# Patient Record
Sex: Female | Born: 1960 | Race: Black or African American | Hispanic: No | Marital: Married | Smoking: Never smoker
Health system: Southern US, Community
[De-identification: ages and names within clinical notes are randomized; demographics above are authoritative.]

## PROBLEM LIST (undated history)

## (undated) DIAGNOSIS — J942 Hemothorax: Secondary | ICD-10-CM

## (undated) DIAGNOSIS — Z9889 Other specified postprocedural states: Secondary | ICD-10-CM

## (undated) DIAGNOSIS — E785 Hyperlipidemia, unspecified: Secondary | ICD-10-CM

## (undated) DIAGNOSIS — R112 Nausea with vomiting, unspecified: Secondary | ICD-10-CM

## (undated) HISTORY — PX: ABDOMINAL HYSTERECTOMY: SHX81

## (undated) HISTORY — PX: CHEST TUBE INSERTION: SHX231

## (undated) HISTORY — PX: BREAST LUMPECTOMY: SHX2

---

## 2015-03-29 ENCOUNTER — Emergency Department (INDEPENDENT_AMBULATORY_CARE_PROVIDER_SITE_OTHER): Payer: TRICARE For Life (TFL)

## 2015-03-29 ENCOUNTER — Emergency Department (INDEPENDENT_AMBULATORY_CARE_PROVIDER_SITE_OTHER)
Admission: EM | Admit: 2015-03-29 | Discharge: 2015-03-29 | Disposition: A | Discharge status: Home / Self Care | Payer: TRICARE For Life (TFL) | Source: Home / Self Care | Attending: Emergency Medicine | Admitting: Emergency Medicine

## 2015-03-29 ENCOUNTER — Encounter: Payer: Self-pay | Admitting: *Deleted

## 2015-03-29 DIAGNOSIS — M436 Torticollis: Secondary | ICD-10-CM | POA: Diagnosis not present

## 2015-03-29 DIAGNOSIS — M47892 Other spondylosis, cervical region: Secondary | ICD-10-CM | POA: Diagnosis not present

## 2015-03-29 HISTORY — DX: Hyperlipidemia, unspecified: E78.5

## 2015-03-29 MED ORDER — HYDROCODONE-ACETAMINOPHEN 5-325 MG PO TABS: 2.0000 | ORAL_TABLET | ORAL | Status: DC | PRN

## 2015-03-29 MED ORDER — PREDNISONE 10 MG PO TABS: ORAL_TABLET | ORAL | Status: DC

## 2015-03-29 NOTE — ED Notes (Signed)
Pt c/o 5 days of upper back, neck and head pain without injury. Ears feel stuffy.

## 2015-03-29 NOTE — ED Provider Notes (Signed)
CSN: [id number]     Arrival date & time 03/29/15  1209 History   First MD Initiated Contact with Patient 03/29/15 1239     Chief Complaint  Patient presents with  . Neck Pain   (Consider location/radiation/quality/duration/timing/severity/associated sxs/prior Treatment) Patient is a 54 y.o. female presenting with neck pain.  Neck Pain Pain location:  Occipital region Quality:  Aching Pain radiates to:  Does not radiate Pain severity:  Moderate Pain is:  Same all the time Onset quality:  Sudden Timing:  Constant Progression:  Worsening Chronicity:  New Context: not recent injury   Relieved by:  Nothing Worsened by:  Nothing tried Ineffective treatments:  None tried Risk factors: no hx of head and neck radiation and no recent head injury   Pt has a history of a disc problem in low back.  Pt recently has had a steroid injection.  Pt reports she has been riding a bike and pain seemed to start after riding  Past Medical History  Diagnosis Date  . Hyperlipidemia    Past Surgical History  Procedure Laterality Date  . Abdominal hysterectomy      partial   Family History  Problem Relation Age of Onset  . Cancer Father     Lung CA  . Cancer Sister     Breast CA   History  Substance Use Topics  . Smoking status: Never Smoker   . Smokeless tobacco: Never Used  . Alcohol Use: No   OB History    No data available     Review of Systems  Musculoskeletal: Positive for neck pain.  All other systems reviewed and are negative.   Allergies  Review of patient's allergies indicates no known allergies.  Home Medications   Prior to Admission medications   Medication Sig Start Date End Date Taking? Authorizing Provider  Simvastatin (ZOCOR PO) Take by mouth.   Yes Historical Provider, MD   BP 147/84 mmHg  Pulse 67  Temp(Src) 98 F (36.7 C) (Oral)  Resp 14  Ht 5\' 3"  (1.6 m)  Wt 147 lb (66.679 kg)  BMI 26.05 kg/m2  SpO2 100% Physical Exam  Constitutional: She is  oriented to person, place, and time. She appears well-developed and well-nourished.  HENT:  Head: Normocephalic and atraumatic.  Right Ear: External ear normal.  Left Ear: External ear normal.  Mouth/Throat: Oropharynx is clear and moist.  Eyes: EOM are normal.  Neck: Normal range of motion.  Cardiovascular: Normal rate and normal heart sounds.   Pulmonary/Chest: Effort normal.  Abdominal: She exhibits no distension.  Musculoskeletal: Normal range of motion.  Diffusely tender c spine,  Pain with movement.   Neurological: She is alert and oriented to person, place, and time.  Psychiatric: She has a normal mood and affect.  Nursing note and vitals reviewed.   ED Course  Procedures (including critical care time) Labs Review Labs Reviewed - No data to display  Imaging Review Dg Cervical Spine Complete  03/29/2015   CLINICAL DATA:  Mid cervical neck pain for 1 week. No known injury. Initial encounter.  EXAM: CERVICAL SPINE  4+ VIEWS  COMPARISON:  None.  FINDINGS: The prevertebral soft tissues are normal. The alignment is anatomic through T1 aside from a minimal convex right scoliosis. There is no evidence of acute fracture or traumatic subluxation. The C1-2 articulation appears normal in the AP projection. There is mild uncinate spurring and disc space loss at C3-4 and C4-5. Oblique views demonstrate no significant osseous foraminal stenosis.  IMPRESSION: No acute osseous findings or significant malalignment. Minimal spondylosis.   Electronically Signed   By: Carey Bullocks M.D.   On: 03/29/2015 14:05     MDM  Pt counseled on probable torticollis as well as xray report of arthritic spurring.   1. Torticollis, acute    Prednisone taper Hydrocodone AVS See your Physician for recheck     Elson Areas, PA-C 03/29/15 1431

## 2015-03-29 NOTE — Discharge Instructions (Signed)

## 2015-12-23 ENCOUNTER — Other Ambulatory Visit: Payer: Self-pay | Admitting: Neurological Surgery

## 2016-01-18 ENCOUNTER — Other Ambulatory Visit (HOSPITAL_COMMUNITY): Payer: Self-pay | Admitting: *Deleted

## 2016-01-18 NOTE — Pre-Procedure Instructions (Signed)
    Catherine Grant  01/18/2016     Your procedure is scheduled on Thursday, January 26, 2016 at 7:30 AM.   [facility] Admitting Office at 5:30 AM.   Call this number if you have problems the morning of surgery: [phone]   Any questions prior to day of surgery, please call [phone] between 8 & 4 PM.   Remember:  Do not eat food or drink liquids after midnight Wednesday, 01/25/16.  Take these medicines the morning of surgery with A SIP OF WATER: Gabapentin (Neurontin)   Do not wear jewelry, make-up or nail polish.  Do not wear lotions, powders, or perfumes.  You may wear deodorant.  Do not shave 48 hours prior to surgery.    Do not bring valuables to the hospital.  [facility] is not responsible for any belongings or valuables.  Contacts, dentures or bridgework may not be worn into surgery.  Leave your suitcase in the car.  After surgery it may be brought to your room.  For patients admitted to the hospital, discharge time will be determined by your treatment team.  Special instructions:  See "Preparing for Surgery" Instruction sheet.  Please read over the following fact sheets that you were given. Pain Booklet, Coughing and Deep Breathing, Blood Transfusion Information, MRSA Information and Surgical Site Infection Prevention

## 2016-01-19 ENCOUNTER — Ambulatory Visit (HOSPITAL_COMMUNITY)
Admission: RE | Admit: 2016-01-19 | Discharge: 2016-01-19 | Disposition: A | Discharge status: Home / Self Care | Source: Ambulatory Visit | Attending: Neurological Surgery | Admitting: Neurological Surgery

## 2016-01-19 ENCOUNTER — Encounter (HOSPITAL_COMMUNITY)
Admission: RE | Admit: 2016-01-19 | Discharge: 2016-01-19 | Disposition: A | Discharge status: Home / Self Care | Source: Ambulatory Visit | Attending: Neurological Surgery | Admitting: Neurological Surgery

## 2016-01-19 ENCOUNTER — Encounter (HOSPITAL_COMMUNITY): Payer: Self-pay

## 2016-01-19 DIAGNOSIS — Z01818 Encounter for other preprocedural examination: Secondary | ICD-10-CM | POA: Insufficient documentation

## 2016-01-19 DIAGNOSIS — M4310 Spondylolisthesis, site unspecified: Secondary | ICD-10-CM

## 2016-01-19 DIAGNOSIS — Z0183 Encounter for blood typing: Secondary | ICD-10-CM | POA: Insufficient documentation

## 2016-01-19 DIAGNOSIS — Z01812 Encounter for preprocedural laboratory examination: Secondary | ICD-10-CM | POA: Diagnosis not present

## 2016-01-19 HISTORY — DX: Nausea with vomiting, unspecified: R11.2

## 2016-01-19 HISTORY — DX: Other specified postprocedural states: Z98.890

## 2016-01-19 HISTORY — DX: Hemothorax: J94.2

## 2016-01-19 LAB — PROTIME-INR
INR: 0.96 (ref 0.00–1.49)
Prothrombin Time: 13 seconds (ref 11.6–15.2)

## 2016-01-19 LAB — CBC WITH DIFFERENTIAL/PLATELET
Basophils Absolute: 0 10*3/uL (ref 0.0–0.1)
Basophils Relative: 0 %
Eosinophils Absolute: 0.1 10*3/uL (ref 0.0–0.7)
Eosinophils Relative: 2 %
HCT: 40.5 % (ref 36.0–46.0)
Hemoglobin: 12.6 g/dL (ref 12.0–15.0)
Lymphocytes Relative: 39 %
Lymphs Abs: 1.8 10*3/uL (ref 0.7–4.0)
MCH: 29.7 pg (ref 26.0–34.0)
MCHC: 31.1 g/dL (ref 30.0–36.0)
MCV: 95.5 fL (ref 78.0–100.0)
Monocytes Absolute: 0.3 10*3/uL (ref 0.1–1.0)
Monocytes Relative: 7 %
Neutro Abs: 2.4 10*3/uL (ref 1.7–7.7)
Neutrophils Relative %: 52 %
Platelets: 241 10*3/uL (ref 150–400)
RBC: 4.24 MIL/uL (ref 3.87–5.11)
RDW: 13.3 % (ref 11.5–15.5)
WBC: 4.6 10*3/uL (ref 4.0–10.5)

## 2016-01-19 LAB — SURGICAL PCR SCREEN
MRSA, PCR: NEGATIVE
Staphylococcus aureus: POSITIVE — AB

## 2016-01-19 LAB — TYPE AND SCREEN
ABO/RH(D): O POS
ANTIBODY SCREEN: NEGATIVE

## 2016-01-19 LAB — BASIC METABOLIC PANEL WITH GFR
Anion gap: 9 (ref 5–15)
BUN: 14 mg/dL (ref 6–20)
CO2: 23 mmol/L (ref 22–32)
Calcium: 9.6 mg/dL (ref 8.9–10.3)
Chloride: 111 mmol/L (ref 101–111)
Creatinine, Ser: 1.12 mg/dL — ABNORMAL HIGH (ref 0.44–1.00)
GFR calc Af Amer: >60 mL/min
GFR calc non Af Amer: 55 mL/min — ABNORMAL LOW
Glucose, Bld: 78 mg/dL (ref 65–99)
Potassium: 4.5 mmol/L (ref 3.5–5.1)
Sodium: 143 mmol/L (ref 135–145)

## 2016-01-19 LAB — ABO/RH: ABO/RH(D): O POS

## 2016-01-25 MED ORDER — DEXAMETHASONE SODIUM PHOSPHATE 10 MG/ML IJ SOLN
10.0000 mg | INTRAMUSCULAR | Status: AC
  Administered 2016-01-26: 10 mg via INTRAVENOUS
  Filled 2016-01-25: qty 1

## 2016-01-25 MED ORDER — CEFAZOLIN SODIUM-DEXTROSE 2-4 GM/100ML-% IV SOLN
2.0000 g | INTRAVENOUS | Status: AC
  Administered 2016-01-26: 2 g via INTRAVENOUS
  Filled 2016-01-25: qty 100

## 2016-01-26 ENCOUNTER — Inpatient Hospital Stay (HOSPITAL_COMMUNITY)

## 2016-01-26 ENCOUNTER — Encounter (HOSPITAL_COMMUNITY): Payer: Self-pay | Admitting: Neurological Surgery

## 2016-01-26 ENCOUNTER — Inpatient Hospital Stay (HOSPITAL_COMMUNITY): Admitting: Anesthesiology

## 2016-01-26 ENCOUNTER — Inpatient Hospital Stay (HOSPITAL_COMMUNITY)
Admission: RE | Admit: 2016-01-26 | Discharge: 2016-01-27 | DRG: 460 | Disposition: A | Discharge status: Home / Self Care | Source: Ambulatory Visit | Attending: Neurological Surgery | Admitting: Neurological Surgery

## 2016-01-26 ENCOUNTER — Encounter (HOSPITAL_COMMUNITY)
Admission: RE | Disposition: A | Discharge status: Home / Self Care | Payer: Self-pay | Source: Ambulatory Visit | Attending: Neurological Surgery

## 2016-01-26 DIAGNOSIS — Z888 Allergy status to other drugs, medicaments and biological substances status: Secondary | ICD-10-CM | POA: Diagnosis not present

## 2016-01-26 DIAGNOSIS — M549 Dorsalgia, unspecified: Secondary | ICD-10-CM

## 2016-01-26 DIAGNOSIS — M4806 Spinal stenosis, lumbar region: Secondary | ICD-10-CM | POA: Diagnosis present

## 2016-01-26 DIAGNOSIS — Z79899 Other long term (current) drug therapy: Secondary | ICD-10-CM | POA: Diagnosis not present

## 2016-01-26 DIAGNOSIS — Z981 Arthrodesis status: Secondary | ICD-10-CM

## 2016-01-26 HISTORY — PX: TRANSFORAMINAL LUMBAR INTERBODY FUSION (TLIF) WITH PEDICLE SCREW FIXATION 1 LEVEL: SHX6141

## 2016-01-26 SURGERY — TRANSFORAMINAL LUMBAR INTERBODY FUSION (TLIF) WITH PEDICLE SCREW FIXATION 1 LEVEL
Anesthesia: General | Site: Back

## 2016-01-26 MED ORDER — MORPHINE SULFATE (PF) 2 MG/ML IV SOLN
1.0000 mg | INTRAVENOUS | Status: DC | PRN
  Administered 2016-01-26: 4 mg via INTRAVENOUS
  Filled 2016-01-26: qty 2

## 2016-01-26 MED ORDER — GABAPENTIN 300 MG PO CAPS
300.0000 mg | ORAL_CAPSULE | Freq: Every day | ORAL | Status: DC
  Administered 2016-01-27: 300 mg via ORAL
  Filled 2016-01-26: qty 1

## 2016-01-26 MED ORDER — SUCCINYLCHOLINE CHLORIDE 20 MG/ML IJ SOLN
INTRAMUSCULAR | Status: AC
  Filled 2016-01-26: qty 1

## 2016-01-26 MED ORDER — FENTANYL CITRATE (PF) 100 MCG/2ML IJ SOLN
25.0000 ug | INTRAMUSCULAR | Status: DC | PRN
  Administered 2016-01-26 (×3): 50 ug via INTRAVENOUS

## 2016-01-26 MED ORDER — MENTHOL 3 MG MT LOZG: 1.0000 | LOZENGE | OROMUCOSAL | Status: DC | PRN

## 2016-01-26 MED ORDER — THROMBIN 5000 UNITS EX SOLR
OROMUCOSAL | Status: DC | PRN
  Administered 2016-01-26: 10 mL via TOPICAL

## 2016-01-26 MED ORDER — SCOPOLAMINE 1 MG/3DAYS TD PT72
MEDICATED_PATCH | TRANSDERMAL | Status: DC | PRN
  Administered 2016-01-26: 1 via TRANSDERMAL

## 2016-01-26 MED ORDER — THROMBIN 20000 UNITS EX SOLR
CUTANEOUS | Status: DC | PRN
  Administered 2016-01-26: 20 mL via TOPICAL

## 2016-01-26 MED ORDER — POTASSIUM CHLORIDE IN NACL 20-0.9 MEQ/L-% IV SOLN
INTRAVENOUS | Status: DC
  Filled 2016-01-26 (×3): qty 1000

## 2016-01-26 MED ORDER — FENTANYL CITRATE (PF) 250 MCG/5ML IJ SOLN
INTRAMUSCULAR | Status: AC
  Filled 2016-01-26: qty 5

## 2016-01-26 MED ORDER — HYDROMORPHONE HCL 1 MG/ML IJ SOLN
INTRAMUSCULAR | Status: AC
  Administered 2016-01-26: 0.5 mg via INTRAVENOUS
  Filled 2016-01-26: qty 1

## 2016-01-26 MED ORDER — HYDROMORPHONE HCL 1 MG/ML IJ SOLN
0.2500 mg | INTRAMUSCULAR | Status: DC | PRN
  Administered 2016-01-26: 0.5 mg via INTRAVENOUS

## 2016-01-26 MED ORDER — PROPOFOL 10 MG/ML IV BOLUS
INTRAVENOUS | Status: AC
  Filled 2016-01-26: qty 20

## 2016-01-26 MED ORDER — ROCURONIUM BROMIDE 50 MG/5ML IV SOLN
INTRAVENOUS | Status: AC
  Filled 2016-01-26: qty 1

## 2016-01-26 MED ORDER — OXYCODONE-ACETAMINOPHEN 5-325 MG PO TABS
ORAL_TABLET | ORAL | Status: AC
  Administered 2016-01-26: 2 via ORAL
  Filled 2016-01-26: qty 2

## 2016-01-26 MED ORDER — SCOPOLAMINE 1 MG/3DAYS TD PT72
MEDICATED_PATCH | TRANSDERMAL | Status: AC
  Filled 2016-01-26: qty 1

## 2016-01-26 MED ORDER — PHENYLEPHRINE 40 MCG/ML (10ML) SYRINGE FOR IV PUSH (FOR BLOOD PRESSURE SUPPORT)
PREFILLED_SYRINGE | INTRAVENOUS | Status: AC
  Filled 2016-01-26: qty 10

## 2016-01-26 MED ORDER — CELECOXIB 200 MG PO CAPS
200.0000 mg | ORAL_CAPSULE | Freq: Two times a day (BID) | ORAL | Status: DC
  Administered 2016-01-26 – 2016-01-27 (×2): 200 mg via ORAL
  Filled 2016-01-26 (×2): qty 1

## 2016-01-26 MED ORDER — ACETAMINOPHEN 325 MG PO TABS: 650.0000 mg | ORAL_TABLET | ORAL | Status: DC | PRN

## 2016-01-26 MED ORDER — SUCCINYLCHOLINE CHLORIDE 20 MG/ML IJ SOLN
INTRAMUSCULAR | Status: DC | PRN
  Administered 2016-01-26: 120 mg via INTRAVENOUS

## 2016-01-26 MED ORDER — PROPOFOL 1000 MG/100ML IV EMUL
INTRAVENOUS | Status: AC
  Administered 2016-01-26: 100 ug/kg/min via INTRAVENOUS
  Filled 2016-01-26: qty 100

## 2016-01-26 MED ORDER — SODIUM CHLORIDE 0.9 % IV SOLN: 250.0000 mL | INTRAVENOUS | Status: DC

## 2016-01-26 MED ORDER — 0.9 % SODIUM CHLORIDE (POUR BTL) OPTIME
TOPICAL | Status: DC | PRN
  Administered 2016-01-26: 1000 mL

## 2016-01-26 MED ORDER — PHENYLEPHRINE HCL 10 MG/ML IJ SOLN
10.0000 mg | INTRAVENOUS | Status: DC | PRN
  Administered 2016-01-26: 15 ug/min via INTRAVENOUS

## 2016-01-26 MED ORDER — ONDANSETRON HCL 4 MG/2ML IJ SOLN
INTRAMUSCULAR | Status: DC | PRN
  Administered 2016-01-26: 4 mg via INTRAVENOUS

## 2016-01-26 MED ORDER — OXYCODONE-ACETAMINOPHEN 5-325 MG PO TABS
1.0000 | ORAL_TABLET | ORAL | Status: DC | PRN
  Administered 2016-01-26 – 2016-01-27 (×4): 2 via ORAL
  Filled 2016-01-26 (×3): qty 2

## 2016-01-26 MED ORDER — GABAPENTIN 300 MG PO CAPS: 300.0000 mg | ORAL_CAPSULE | Freq: Two times a day (BID) | ORAL | Status: DC

## 2016-01-26 MED ORDER — SODIUM CHLORIDE 0.9 % IJ SOLN
INTRAMUSCULAR | Status: AC
  Filled 2016-01-26: qty 10

## 2016-01-26 MED ORDER — LIDOCAINE HCL (CARDIAC) 20 MG/ML IV SOLN
INTRAVENOUS | Status: AC
  Filled 2016-01-26: qty 5

## 2016-01-26 MED ORDER — PHENOL 1.4 % MT LIQD: 1.0000 | OROMUCOSAL | Status: DC | PRN

## 2016-01-26 MED ORDER — LIDOCAINE HCL (CARDIAC) 20 MG/ML IV SOLN
INTRAVENOUS | Status: DC | PRN
Start: 2016-01-26 — End: 2016-01-26
  Administered 2016-01-26: 100 mg via INTRAVENOUS

## 2016-01-26 MED ORDER — ONDANSETRON HCL 4 MG/2ML IJ SOLN
INTRAMUSCULAR | Status: AC
  Filled 2016-01-26: qty 2

## 2016-01-26 MED ORDER — PROPOFOL 1000 MG/100ML IV EMUL
INTRAVENOUS | Status: AC
  Filled 2016-01-26: qty 100

## 2016-01-26 MED ORDER — SODIUM CHLORIDE 0.9% FLUSH: 3.0000 mL | INTRAVENOUS | Status: DC | PRN

## 2016-01-26 MED ORDER — METHOCARBAMOL 500 MG PO TABS
ORAL_TABLET | ORAL | Status: AC
  Administered 2016-01-26: 500 mg via ORAL
  Filled 2016-01-26: qty 1

## 2016-01-26 MED ORDER — LACTATED RINGERS IV SOLN: INTRAVENOUS | Status: DC

## 2016-01-26 MED ORDER — SODIUM CHLORIDE 0.9% FLUSH
3.0000 mL | Freq: Two times a day (BID) | INTRAVENOUS | Status: DC
  Administered 2016-01-26: 3 mL via INTRAVENOUS

## 2016-01-26 MED ORDER — ONDANSETRON HCL 4 MG/2ML IJ SOLN: 4.0000 mg | INTRAMUSCULAR | Status: DC | PRN

## 2016-01-26 MED ORDER — LACTATED RINGERS IV SOLN
INTRAVENOUS | Status: DC | PRN
  Administered 2016-01-26 (×2): via INTRAVENOUS

## 2016-01-26 MED ORDER — METHOCARBAMOL 1000 MG/10ML IJ SOLN
500.0000 mg | Freq: Four times a day (QID) | INTRAMUSCULAR | Status: DC | PRN
  Filled 2016-01-26: qty 5

## 2016-01-26 MED ORDER — EPHEDRINE SULFATE 50 MG/ML IJ SOLN
INTRAMUSCULAR | Status: AC
  Filled 2016-01-26: qty 1

## 2016-01-26 MED ORDER — FENTANYL CITRATE (PF) 250 MCG/5ML IJ SOLN
INTRAMUSCULAR | Status: DC | PRN
  Administered 2016-01-26: 50 ug via INTRAVENOUS
  Administered 2016-01-26: 150 ug via INTRAVENOUS
  Administered 2016-01-26: 50 ug via INTRAVENOUS
  Administered 2016-01-26: 100 ug via INTRAVENOUS

## 2016-01-26 MED ORDER — FENTANYL CITRATE (PF) 100 MCG/2ML IJ SOLN
INTRAMUSCULAR | Status: AC
  Filled 2016-01-26: qty 2

## 2016-01-26 MED ORDER — CEFAZOLIN SODIUM 1-5 GM-% IV SOLN
1.0000 g | Freq: Three times a day (TID) | INTRAVENOUS | Status: AC
  Administered 2016-01-26 (×2): 1 g via INTRAVENOUS
  Filled 2016-01-26 (×2): qty 50

## 2016-01-26 MED ORDER — PROPOFOL 10 MG/ML IV BOLUS
INTRAVENOUS | Status: DC | PRN
  Administered 2016-01-26: 180 mg via INTRAVENOUS

## 2016-01-26 MED ORDER — GABAPENTIN 300 MG PO CAPS
600.0000 mg | ORAL_CAPSULE | Freq: Every day | ORAL | Status: DC
  Administered 2016-01-26: 600 mg via ORAL
  Filled 2016-01-26: qty 2

## 2016-01-26 MED ORDER — BACITRACIN 50000 UNITS IM SOLR
INTRAMUSCULAR | Status: DC | PRN
  Administered 2016-01-26: 500 mL

## 2016-01-26 MED ORDER — MIDAZOLAM HCL 2 MG/2ML IJ SOLN
INTRAMUSCULAR | Status: AC
  Filled 2016-01-26: qty 2

## 2016-01-26 MED ORDER — FENTANYL CITRATE (PF) 100 MCG/2ML IJ SOLN
INTRAMUSCULAR | Status: AC
  Administered 2016-01-26: 50 ug via INTRAVENOUS
  Filled 2016-01-26: qty 2

## 2016-01-26 MED ORDER — MEPERIDINE HCL 25 MG/ML IJ SOLN: 6.2500 mg | INTRAMUSCULAR | Status: DC | PRN

## 2016-01-26 MED ORDER — EPHEDRINE SULFATE 50 MG/ML IJ SOLN
INTRAMUSCULAR | Status: DC | PRN
  Administered 2016-01-26: 10 mg via INTRAVENOUS

## 2016-01-26 MED ORDER — PROCHLORPERAZINE EDISYLATE 5 MG/ML IJ SOLN: 10.0000 mg | Freq: Four times a day (QID) | INTRAMUSCULAR | Status: DC | PRN

## 2016-01-26 MED ORDER — PHENYLEPHRINE HCL 10 MG/ML IJ SOLN
INTRAMUSCULAR | Status: DC | PRN
  Administered 2016-01-26: 80 ug via INTRAVENOUS
  Administered 2016-01-26: 120 ug via INTRAVENOUS
  Administered 2016-01-26: 40 ug via INTRAVENOUS

## 2016-01-26 MED ORDER — METHOCARBAMOL 500 MG PO TABS
500.0000 mg | ORAL_TABLET | Freq: Four times a day (QID) | ORAL | Status: DC | PRN
  Administered 2016-01-26 (×2): 500 mg via ORAL
  Filled 2016-01-26: qty 1

## 2016-01-26 MED ORDER — MIDAZOLAM HCL 2 MG/2ML IJ SOLN
INTRAMUSCULAR | Status: DC | PRN
  Administered 2016-01-26: 2 mg via INTRAVENOUS

## 2016-01-26 MED ORDER — ACETAMINOPHEN 650 MG RE SUPP: 650.0000 mg | RECTAL | Status: DC | PRN

## 2016-01-26 SURGICAL SUPPLY — 60 items
APL SKNCLS STERI-STRIP NONHPOA (GAUZE/BANDAGES/DRESSINGS) ×1
BAG DECANTER FOR FLEXI CONT (MISCELLANEOUS) ×3 IMPLANT
BENZOIN TINCTURE PRP APPL 2/3 (GAUZE/BANDAGES/DRESSINGS) ×3 IMPLANT
BIT DRILL PLIF MAS 5.0MM DISP (DRILL) ×1 IMPLANT
BLADE CLIPPER SURG (BLADE) IMPLANT
BUR MATCHSTICK NEURO 3.0 LAGG (BURR) ×3 IMPLANT
CAGE ALTERA 8X12-8 (Cage) ×3 IMPLANT
CANISTER SUCT 3000ML PPV (MISCELLANEOUS) ×3 IMPLANT
CLIP NEUROVISION LG (CLIP) ×3 IMPLANT
CLOSURE WOUND 1/2 X4 (GAUZE/BANDAGES/DRESSINGS) ×2
CONT SPEC 4OZ CLIKSEAL STRL BL (MISCELLANEOUS) ×3 IMPLANT
COVER BACK TABLE 60X90IN (DRAPES) ×3 IMPLANT
DRAPE C-ARM 42X72 X-RAY (DRAPES) ×6 IMPLANT
DRAPE LAPAROTOMY 100X72X124 (DRAPES) ×3 IMPLANT
DRAPE POUCH INSTRU U-SHP 10X18 (DRAPES) ×3 IMPLANT
DRAPE SURG 17X23 STRL (DRAPES) ×3 IMPLANT
DRILL PLIF MAS 5.0MM DISP (DRILL) ×3
DRSG OPSITE 4X5.5 SM (GAUZE/BANDAGES/DRESSINGS) ×3 IMPLANT
DRSG OPSITE POSTOP 4X6 (GAUZE/BANDAGES/DRESSINGS) ×3 IMPLANT
DURAPREP 26ML APPLICATOR (WOUND CARE) ×3 IMPLANT
ELECT REM PT RETURN 9FT ADLT (ELECTROSURGICAL) ×3
ELECTRODE REM PT RTRN 9FT ADLT (ELECTROSURGICAL) ×1 IMPLANT
EVACUATOR 1/8 PVC DRAIN (DRAIN) ×3 IMPLANT
GAUZE SPONGE 4X4 16PLY XRAY LF (GAUZE/BANDAGES/DRESSINGS) IMPLANT
GLOVE BIO SURGEON STRL SZ8 (GLOVE) ×6 IMPLANT
GOWN STRL REUS W/ TWL LRG LVL3 (GOWN DISPOSABLE) IMPLANT
GOWN STRL REUS W/ TWL XL LVL3 (GOWN DISPOSABLE) ×2 IMPLANT
GOWN STRL REUS W/TWL 2XL LVL3 (GOWN DISPOSABLE) IMPLANT
GOWN STRL REUS W/TWL LRG LVL3 (GOWN DISPOSABLE)
GOWN STRL REUS W/TWL XL LVL3 (GOWN DISPOSABLE) ×6
HEMOSTAT POWDER KIT SURGIFOAM (HEMOSTASIS) IMPLANT
KIT BASIN OR (CUSTOM PROCEDURE TRAY) ×3 IMPLANT
KIT INFUSE X SMALL 1.4CC (Orthopedic Implant) ×3 IMPLANT
KIT ROOM TURNOVER OR (KITS) ×3 IMPLANT
MILL MEDIUM DISP (BLADE) IMPLANT
MODULE NVM5 NEXT GEN EMG (NEEDLE) ×3 IMPLANT
NEEDLE HYPO 25X1 1.5 SAFETY (NEEDLE) ×3 IMPLANT
NS IRRIG 1000ML POUR BTL (IV SOLUTION) ×3 IMPLANT
PACK LAMINECTOMY NEURO (CUSTOM PROCEDURE TRAY) ×3 IMPLANT
PAD ARMBOARD 7.5X6 YLW CONV (MISCELLANEOUS) ×9 IMPLANT
ROD 35MM (Rod) ×6 IMPLANT
SCREW LOCK (Screw) ×12 IMPLANT
SCREW LOCK FXNS SPNE MAS PL (Screw) ×4 IMPLANT
SCREW PLIF MAS 5.0X35 (Screw) ×6 IMPLANT
SCREW SHANK 5.0X30MM (Screw) ×3 IMPLANT
SCREW SHANK 5.0X35 (Screw) ×3 IMPLANT
SCREW TULIP 5.5 (Screw) ×6 IMPLANT
SPONGE LAP 4X18 X RAY DECT (DISPOSABLE) IMPLANT
SPONGE SURGIFOAM ABS GEL 100 (HEMOSTASIS) ×3 IMPLANT
STRIP CLOSURE SKIN 1/2X4 (GAUZE/BANDAGES/DRESSINGS) ×4 IMPLANT
SUT VIC AB 0 CT1 18XCR BRD8 (SUTURE) ×1 IMPLANT
SUT VIC AB 0 CT1 8-18 (SUTURE) ×2
SUT VIC AB 2-0 CP2 18 (SUTURE) ×3 IMPLANT
SUT VIC AB 3-0 SH 8-18 (SUTURE) ×6 IMPLANT
TAPE STRIPS DRAPE STRL (GAUZE/BANDAGES/DRESSINGS) ×3 IMPLANT
TOWEL OR 17X24 6PK STRL BLUE (TOWEL DISPOSABLE) ×3 IMPLANT
TOWEL OR 17X26 10 PK STRL BLUE (TOWEL DISPOSABLE) ×3 IMPLANT
TRAP SPECIMEN MUCOUS 40CC (MISCELLANEOUS) IMPLANT
TRAY FOLEY W/METER SILVER 14FR (SET/KITS/TRAYS/PACK) ×3 IMPLANT
WATER STERILE IRR 1000ML POUR (IV SOLUTION) ×3 IMPLANT

## 2016-01-26 NOTE — Transfer of Care (Signed)
Immediate Anesthesia Transfer of Care Note  Patient: Catherine Grant  Procedure(s) Performed: Procedure(s) with comments: L4-5 TRANSFORAMINAL LUMBAR INTERBODY FUSION (TLIF) WITH PEDICLE SCREW FIXATION  (N/A) - L4-5 TRANSFORAMINAL LUMBAR INTERBODY FUSION (TLIF) WITH PEDICLE SCREW FIXATION   Patient Location: PACU  Anesthesia Type:General  Level of Consciousness: awake  Airway & Oxygen Therapy: Patient Spontanous Breathing and Patient connected to nasal cannula oxygen  Post-op Assessment: Report given to RN and Patient moving all extremities X 4  Post vital signs: Reviewed and stable  Last Vitals:  Filed Vitals:   01/26/16 0640  BP: 141/82  Pulse: 62  Temp: 36.9 C  Resp: 20    Complications: No apparent anesthesia complications

## 2016-01-26 NOTE — Op Note (Signed)
01/26/2016  10:22 AM  PATIENT:  Catherine Grant  55 y.o. 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 Decompressive lumbar laminectomy L4-5 bilaterally requiring more work than would be required for a simple exposure of the disk for TLIF in order to adequately decompress the neural elements and address the spinal stenosis 2. Transforaminal lumbar interbody fusion L4-5 using globus expandable interbody cage packed with morcellized allograft and autograft 3. Posterior fixation L4-5 using cortical pedicle screws.  4. Intertransverse arthrodesis L4-5 using morcellized autograft and allograft.  SURGEON:  Marikay Alaravid Raeley Gilmore, MD  ASSISTANTS:  Venetia MaxonStern  ANESTHESIA:  General  EBL:  150 ml  Total I/O In: 1000 [I.V.:1000] Out: 500 [Urine:350; Blood:150]  BLOOD ADMINISTERED:none  DRAINS:  none INDICATION FOR PROCEDURE:  this patient presented with severe back and right leg pain that was progressive and chronic. She had failure to response to medical management. MRI showed a grade 1-2 spondylolisthesis L4-5 with spinal stenosis. Recommend a decompression and instrument effusion to address her segmental instability and spinal stenosis. Her pain was debilitating. Patient understood the risks, benefits, and alternatives and potential outcomes and wished to proceed.  PROCEDURE DETAILS:  The patient was brought to the operating room. After induction of generalized endotracheal anesthesia the patient was rolled into the prone position on chest rolls and all pressure points were padded. The patient's lumbar region was cleaned and then prepped with DuraPrep and draped in the usual sterile fashion. Anesthesia was injected and then a dorsal midline incision was made and carried down to the lumbosacral fascia. The fascia was opened and the paraspinous musculature was taken down in a subperiosteal  fashion to exL4-5. A self-retaining retractor was placed. Intraoperative fluoroscopy confirmed my level, and I started with placement of thL4 cortical pedicle screws. The pedicle screw entry zones were identified utilizing surface landmarks and  AP and lateral fluoroscopy. I scored the cortex with the high-speed drill and then used the hand drill and EMG monitoring to drill an upward and outward direction into the pedicle. I then tapped line to line, and the tap was also monitored. I then placed a 5-0 x 35 mm cortical pedicle screw into the pedicles ofL4 bilaterally. I then turned my attention to the decompression lumbar laminectomies, hemi- facetectomies, and foraminotomies were performed at45 bilaterallyient had significant spinal stenosis and this required more work than would be required for a simple exposure of the disc for posterior lumbar interbody fusion. Much more generous decompression was undertaken in order to adequately decompress the neural elements and address the patient's leg pain. The yellow ligament was removed to expose the underlying dura and nerve roots, and generous foraminotomies were performed to adequately decompress the neural elements. Both the exiting and traversing nerve roots were decompressed on both sides until a coronary dilator passed easily along the nerve roots. Once the decompression was complete, I turned my attention to the posterior lower lumbar interbody fusion. The epidural venous vasculature was coagulated and cut sharply. Disc space was incised and the initial discectomy was performed with pituitary rongeurs. The disc space was distracted with sequential distractors to a height 9 mm then used a series of scrapers and shavers to prepare the endplates for fusion. The midline was prepared with Epstein curettes. Once the complete discectomy was finished, we packed an appropriate slocus expandable interbody cage with local autograft and morcellized allograft, gently retracted  the nerve root, and  tapped the cage into position at L45 from the right. I then packed behind the cagewith morselized autograft and allograft. We then turned our attention to the placement of the lower pedicle screws. The pedicle screw entry zones were identified utilizing surface landmarks and fluoroscopy. I drilled into each pedicle utilizing the hand drill and EMG monitoring, and tapped each pedicle with the appropriate tap. We palpated with a ball probe to assure no break in the cortex. We then placed 5-0 x 35 mm cortical pedicle screws the pedicles bilaterally at L5. We then decorticated the transverse processes and laid a mixture of morcellized autograft and allograft out over these to perform intertransverse arthrodesis at L4-5. We then placed lordotic rods into the multiaxial screw heads of the pedicle screws and locked these in position with the locking caps and anti-torque device. We then checked our construct with AP and lateral fluoroscopy. Irrigated with copious amounts of bacitracin-containing saline solution. Placed a medium Hemovac drain through separate stab incision. Inspected the nerve roots once again to assure adequate decompression, lined to the dura with Gelfoam, and closed the muscle and the fascia with 0 Vicryl. Closed the subcutaneous tissues with 2-0 Vicryl and subcuticular tissues with 3-0 Vicryl. The skin was closed with benzoin and Steri-Strips. Dressing was then applied, the patient was awakened from general anesthesia and transported to the recovery room in stable condition. At the end of the procedure all sponge, needle and instrument counts were correct.   PLAN OF CARE: Admit to inpatient   PATIENT DISPOSITION:  PACU - hemodynamically stable.   Delay start of Pharmacological VTE agent (>24hrs) due to surgical blood loss or risk of bleeding:  yes

## 2016-01-26 NOTE — Progress Notes (Signed)
Patient's BP 88/39; P 55; O2Sat 100; RR16; T 99, denies lightheadedness or dizziness. Instructed patient to drink more fluids, IVF infusing and  positioned patient on trendelenburg. Will continue to monitor.

## 2016-01-26 NOTE — Anesthesia Procedure Notes (Signed)
Procedure Name: Intubation Date/Time: 01/26/2016 7:37 AM Performed by: De NurseENNIE, Primrose Oler E Pre-anesthesia Checklist: Patient identified, Emergency Drugs available, Suction available, Patient being monitored and Timeout performed Patient Re-evaluated:Patient Re-evaluated prior to inductionOxygen Delivery Method: Circle system utilized Preoxygenation: Pre-oxygenation with 100% oxygen Intubation Type: IV induction Ventilation: Mask ventilation without difficulty Laryngoscope Size: Mac and 3 Grade View: Grade II Tube type: Oral Tube size: 7.0 mm Number of attempts: 1 Airway Equipment and Method: Stylet Placement Confirmation: ETT inserted through vocal cords under direct vision,  positive ETCO2 and breath sounds checked- equal and bilateral Secured at: 20 cm Tube secured with: Tape Dental Injury: Teeth and Oropharynx as per pre-operative assessment

## 2016-01-26 NOTE — Anesthesia Postprocedure Evaluation (Signed)
Anesthesia Post Note  Patient: [name]  Procedure(s) Performed: Procedure(s) (LRB): L4-5 TRANSFORAMINAL LUMBAR INTERBODY FUSION (TLIF) WITH PEDICLE SCREW FIXATION  (N/A)  Patient location during evaluation: PACU Anesthesia Type: General Level of consciousness: awake and alert Pain management: pain level controlled Vital Signs Assessment: post-procedure vital signs reviewed and stable Respiratory status: spontaneous breathing, nonlabored ventilation, respiratory function stable and patient connected to nasal cannula oxygen Cardiovascular status: blood pressure returned to baseline and stable Postop Assessment: no signs of nausea or vomiting Anesthetic complications: no    Last Vitals:  Filed Vitals:   01/26/16 1239 01/26/16 1309  BP: 113/70 103/63  Pulse: 54 58  Temp:    Resp:      Last Pain:  Filed Vitals:   01/26/16 1316  PainSc: 7                  Phillips Groutarignan, Baylee Mccorkel

## 2016-01-26 NOTE — H&P (Signed)
Subjective: Patient is a 55 y.o. female admitted for TLIF. Onset of symptoms was 1 year ago, gradually worsening since that time.  The pain is rated severe, and is located at the across the lower back and radiates to RLE. The pain is described as aching and occurs all day. The symptoms have been progressive. Symptoms are exacerbated by exercise. MRI or CT showed spondylolisthesis with stenosis L4-5.   Past Medical History  Diagnosis Date  . Hyperlipidemia   . PONV (postoperative nausea and vomiting)   . Pneumohemothorax     15 - 20  YRS AGO   CT PLACED    Past Surgical History  Procedure Laterality Date  . Abdominal hysterectomy      partial  . Chest tube insertion    . Breast lumpectomy      RT      1981     Prior to Admission medications   Medication Sig Start Date End Date Taking? Authorizing Provider  gabapentin (NEURONTIN) 300 MG capsule Take 300-600 mg by mouth 2 (two) times daily. Take 1 capsule in the morning Take 2 capsules at night   Yes Historical Provider, MD  simvastatin (ZOCOR) 80 MG tablet Take 40 mg by mouth daily.   Yes Historical Provider, MD  HYDROcodone-acetaminophen (NORCO/VICODIN) 5-325 MG per tablet Take 2 tablets by mouth every 4 (four) hours as needed. Patient not taking: Reported on 01/17/2016 03/29/15   Elson AreasLeslie K Sofia, PA-C  predniSONE (DELTASONE) 10 MG tablet [phone],5,4,3,2,1 taper Patient not taking: Reported on 01/17/2016 03/29/15   Elson AreasLeslie K Sofia, PA-C  Simvastatin (ZOCOR PO) Take by mouth.    Historical Provider, MD   Allergies  Allergen Reactions  . Other Other (See Comments)    The patient is allergic to a medication but is unsure of the name. She will provide information at PAT  . Bupropion Rash    Social History  Substance Use Topics  . Smoking status: Never Smoker   . Smokeless tobacco: Never Used  . Alcohol Use: No    Family History  Problem Relation Age of Onset  . Cancer Father     Lung CA  . Cancer Sister     Breast CA     Review of  Systems  Positive ROS: neg  All other systems have been reviewed and were otherwise negative with the exception of those mentioned in the HPI and as above.  Objective: Vital signs in last 24 hours: Weight:  [73.086 kg (161 lb 2 oz)] 73.086 kg (161 lb 2 oz) (03/30 0631)  General Appearance: Alert, cooperative, no distress, appears stated age Head: Normocephalic, without obvious abnormality, atraumatic Eyes: PERRL, conjunctiva/corneas clear, EOM's intact    Neck: Supple, symmetrical, trachea midline Back: Symmetric, no curvature, ROM normal, no CVA tenderness Lungs:  respirations unlabored Heart: Regular rate and rhythm Abdomen: Soft, non-tender Extremities: Extremities normal, atraumatic, no cyanosis or edema Pulses: 2+ and symmetric all extremities Skin: Skin color, texture, turgor normal, no rashes or lesions  NEUROLOGIC:   Mental status: Alert and oriented x4,  no aphasia, good attention span, fund of knowledge, and memory Motor Exam - grossly normal Sensory Exam - grossly normal Reflexes: 1+ Coordination - grossly normal Gait - grossly normal Balance - grossly normal Cranial Nerves: I: smell Not tested  II: visual acuity  OS: nl    OD: nl  II: visual fields Full to confrontation  II: pupils Equal, round, reactive to light  III,VII: ptosis None  III,IV,VI: extraocular muscles  Full ROM  V: mastication Normal  V: facial light touch sensation  Normal  V,VII: corneal reflex  Present  VII: facial muscle function - upper  Normal  VII: facial muscle function - lower Normal  VIII: hearing Not tested  IX: soft palate elevation  Normal  IX,X: gag reflex Present  XI: trapezius strength  5/5  XI: sternocleidomastoid strength 5/5  XI: neck flexion strength  5/5  XII: tongue strength  Normal    Data Review Lab Results  Component Value Date   WBC 4.6 01/19/2016   HGB 12.6 01/19/2016   HCT 40.5 01/19/2016   MCV 95.5 01/19/2016   PLT 241 01/19/2016   Lab Results   Component Value Date   NA 143 01/19/2016   K 4.5 01/19/2016   CL 111 01/19/2016   CO2 23 01/19/2016   BUN 14 01/19/2016   CREATININE 1.12* 01/19/2016   GLUCOSE 78 01/19/2016   Lab Results  Component Value Date   INR 0.96 01/19/2016    Assessment/Plan: Patient admitted for TLIF. Patient has failed a reasonable attempt at conservative therapy.  I explained the condition and procedure to the patient and answered any questions.  Patient wishes to proceed with procedure as planned. Understands risks/ benefits and typical outcomes of procedure.   Jaythan Hinely S 01/26/2016 6:35 AM

## 2016-01-26 NOTE — Anesthesia Preprocedure Evaluation (Addendum)
Anesthesia Evaluation  Patient identified by MRN, date of birth, ID band Patient awake    Reviewed: Allergy & Precautions, NPO status , Patient's Chart, lab work & pertinent test results  History of Anesthesia Complications (+) PONV  Airway Mallampati: II  TM Distance: >3 FB Neck ROM: Full    Dental no notable dental hx. (+) Teeth Intact, Dental Advisory Given   Pulmonary  H/o PTX 15-20 yrs ago   Pulmonary exam normal breath sounds clear to auscultation       Cardiovascular negative cardio ROS Normal cardiovascular exam Rhythm:Regular Rate:Normal     Neuro/Psych negative neurological ROS  negative psych ROS   GI/Hepatic negative GI ROS, Neg liver ROS,   Endo/Other  negative endocrine ROS  Renal/GU negative Renal ROS  negative genitourinary   Musculoskeletal negative musculoskeletal ROS (+) Lumbar spinal stenosis   Abdominal   Peds negative pediatric ROS (+)  Hematology negative hematology ROS (+)   Anesthesia Other Findings   Reproductive/Obstetrics negative OB ROS                           Anesthesia Physical Anesthesia Plan  ASA: I  Anesthesia Plan: General   Post-op Pain Management:    Induction: Intravenous  Airway Management Planned: Oral ETT  Additional Equipment:   Intra-op Plan:   Post-operative Plan: Extubation in OR  Informed Consent: I have reviewed the patients History and Physical, chart, labs and discussed the procedure including the risks, benefits and alternatives for the proposed anesthesia with the patient or authorized representative who has indicated his/her understanding and acceptance.   Dental advisory given  Plan Discussed with: CRNA  Anesthesia Plan Comments:         Anesthesia Quick Evaluation

## 2016-01-27 MED ORDER — HYDROCODONE-ACETAMINOPHEN 5-325 MG PO TABS: 2.0000 | ORAL_TABLET | ORAL | Status: DC | PRN

## 2016-01-27 MED ORDER — METHOCARBAMOL 500 MG PO TABS: 500.0000 mg | ORAL_TABLET | Freq: Four times a day (QID) | ORAL | Status: DC | PRN

## 2016-01-27 NOTE — Progress Notes (Signed)
Occupational Therapy Evaluation/Discharge Patient Details Name: Catherine Grant MRN: [id number] DOB: 12/08/1960 Today's Date: 01/27/2016    History of Present Illness 55 y.o. female s/p L4-5 transforaminal lumbar interbody fusion. PMH significant for HLD, PONV, pneumohemothorax with chest tube placed (15-20 yrs ago), abdominal hysterectomy, and breat lumpectomy (1981).   Clinical Impression   PTA, pt was independent with ADLs and mobility. Educated pt and pt's husband on back precautions, brace wear protocol and compensatory strategies for ADLs. Overall, pt completed LB ADLs and transfers at modified independent level. Educated pt on energy conservation, to gradually increase activity level, and fall prevention strategies. All education has been completed and pt has no further questions. No OT follow up or DME recommended at this time. Pt with no further acute OT needs. OT signing off.    Follow Up Recommendations  No OT follow up;Supervision - Intermittent    Equipment Recommendations  None recommended by OT    Recommendations for Other Services       Precautions / Restrictions Precautions Precautions: Back Precaution Booklet Issued: Yes (comment) Precaution Comments: Pt able to recall 2/3 back precautions. Reviewed handout and pt able to recall 3/3 back precautions at end of session. Required Braces or Orthoses: Spinal Brace Spinal Brace: Lumbar corset;Applied in sitting position Restrictions Weight Bearing Restrictions: No      Mobility Bed Mobility Overal bed mobility: Modified Independent Bed Mobility: Rolling;Sidelying to Sit;Sit to Sidelying Rolling: Modified independent (Device/Increase time) Sidelying to sit: Modified independent (Device/Increase time)     Sit to sidelying: Modified independent (Device/Increase time) General bed mobility comments: Verbal cues for proper log roll technique  Transfers Overall transfer level: Modified independent Equipment used:  None             General transfer comment: No physical assist required. Verbal cues for safe hand placement on seated surfacaes.    Balance Overall balance assessment: Needs assistance Sitting-balance support: No upper extremity supported;Feet supported Sitting balance-Leahy Scale: Good     Standing balance support: No upper extremity supported;During functional activity Standing balance-Leahy Scale: Good                              ADL Overall ADL's : Needs assistance/impaired     Grooming: Wash/dry hands;Supervision/safety;Standing   Upper Body Bathing: Set up;Sitting   Lower Body Bathing: Supervison/ safety;Sit to/from stand;Cueing for compensatory techniques Lower Body Bathing Details (indicate cue type and reason): Able to cross ankle-over-knee Upper Body Dressing : Set up;Standing Upper Body Dressing Details (indicate cue type and reason): Caregiver providing set up Lower Body Dressing: Set up;Sit to/from stand;Cueing for compensatory techniques Lower Body Dressing Details (indicate cue type and reason): Able to cross ankle-over-knee Toilet Transfer: Supervision/safety;Cueing for safety;Ambulation;Regular Teacher, adult educationToilet Toilet Transfer Details (indicate cue type and reason): Cues to feel toilet on back of legs before reaching back to sit Toileting- Clothing Manipulation and Hygiene: Supervision/safety;Sit to/from stand;Cueing for compensatory techniques Toileting - Clothing Manipulation Details (indicate cue type and reason): Cues to squat and reach behind to avoid twisting, educated on use of wet wipes Tub/ Shower Transfer: Tub transfer;Min guard;Cueing for safety;Ambulation Tub/Shower Transfer Details (indicate cue type and reason): Advised pt to use shower seat/3in1 intially in tub Functional mobility during ADLs: Min guard General ADL Comments: Reviewed back precautions, positioning for sleep, brace wear protocol, compensatory strategies for LB ADLs, energy  conservation, home safety, and fall prevention strategies. Pt's husband present for OT eval.  Vision Vision Assessment?: No apparent visual deficits   Perception     Praxis      Pertinent Vitals/Pain Pain Assessment: 0-10 Pain Score: 8  Pain Location: back Pain Descriptors / Indicators: Sore Pain Intervention(s): Monitored during session;Repositioned;Patient requesting pain meds-RN notified     Hand Dominance Right   Extremity/Trunk Assessment Upper Extremity Assessment Upper Extremity Assessment: Overall WFL for tasks assessed   Lower Extremity Assessment Lower Extremity Assessment: Overall WFL for tasks assessed   Cervical / Trunk Assessment Cervical / Trunk Assessment: Normal   Communication Communication Communication: No difficulties   Cognition Arousal/Alertness: Awake/alert Behavior During Therapy: WFL for tasks assessed/performed Overall Cognitive Status: Within Functional Limits for tasks assessed                     General Comments       Exercises       Shoulder Instructions      Home Living Family/patient expects to be discharged to:: Private residence Living Arrangements: Spouse/significant other Available Help at Discharge: Family;Available 24 hours/day Type of Home: House Home Access: Level entry     Home Layout: Two level;Bed/bath upstairs Alternate Level Stairs-Number of Steps: 16 Alternate Level Stairs-Rails: Right Bathroom Shower/Tub: Tub/shower unit;Curtain Shower/tub characteristics: Engineer, building services: Standard     Home Equipment: Grab bars - tub/shower;Hand held shower head          Prior Functioning/Environment Level of Independence: Independent        Comments: Works at [employer] at the [employer]    OT Diagnosis: Acute pain   OT Problem List: Decreased strength;Decreased range of motion;Decreased activity tolerance;Impaired balance (sitting and/or standing);Decreased safety awareness;Decreased  knowledge of use of DME or AE;Decreased knowledge of precautions;Impaired UE functional use   OT Treatment/Interventions:      OT Goals(Current goals can be found in the care plan section) Acute Rehab OT Goals Patient Stated Goal: to go home today OT Goal Formulation: With patient Time For Goal Achievement: 02/10/16 Potential to Achieve Goals: Good  OT Frequency:     Barriers to D/C:            Co-evaluation              End of Session Equipment Utilized During Treatment: Gait belt;Back brace Nurse Communication: Mobility status  Activity Tolerance: Patient tolerated treatment well Patient left: in chair;with call bell/phone within reach;with family/visitor present   Time: 1610-9604 OT Time Calculation (min): 22 min Charges:  OT General Charges $OT Visit: 1 Procedure OT Evaluation $OT Eval Low Complexity: 1 Procedure G-Codes:    Nils Pyle, OTR/L Pager: [phone] 01/27/2016, 9:40 AM

## 2016-01-27 NOTE — Progress Notes (Signed)
Physical Therapy Evaluation and Discharge  Clinical Impression: Patient evaluated by Physical Therapy with no further acute PT needs identified. All education has been completed and the patient has no further questions. Very motivated, did not require physical assist to ambulate or navigate steps this date. Good family support. See below for any follow-up Physical Therapy or equipment needs. PT is signing off. Thank you for this referral.     01/27/16 0933  PT Visit Information  Last PT Received On 01/27/16  Assistance Needed +1  History of Present Illness 55 y.o. female s/p L4-5 transforaminal lumbar interbody fusion. PMH significant for HLD, PONV, pneumohemothorax with chest tube placed (15-20 yrs ago), abdominal hysterectomy, and breat lumpectomy (1981).  Precautions  Precautions Back  Precaution Booklet Issued Yes (comment)  Precaution Comments Reviewed back precautions  Required Braces or Orthoses Spinal Brace  Spinal Brace Lumbar corset;Applied in sitting position  Restrictions  Weight Bearing Restrictions No  Home Living  Family/patient expects to be discharged to: Private residence  Living Arrangements Spouse/significant other  Available Help at Discharge Family;Available 24 hours/day  Type of Home House  Home Access Level entry  Home Layout Two level;Bed/bath upstairs  Alternate Level Stairs-Number of Steps 16  Alternate Level Stairs-Rails Right  Bathroom Shower/Tub Tub/shower unit;Curtain  Tour managerBathroom Toilet Standard  Home Equipment Grab bars - tub/shower;Hand held shower head  Prior Function  Level of Independence Independent  Comments Works at [employer] at the [employer]  Communication  Communication No difficulties  Pain Assessment  Pain Assessment 0-10  Pain Score 8  Pain Location back  Pain Descriptors / Indicators Sore  Pain Intervention(s) Monitored during session;Repositioned;Patient requesting pain meds-RN notified  Cognition  Arousal/Alertness  Awake/alert  Behavior During Therapy WFL for tasks assessed/performed  Overall Cognitive Status Within Functional Limits for tasks assessed  Upper Extremity Assessment  Upper Extremity Assessment Defer to OT evaluation  Lower Extremity Assessment  Lower Extremity Assessment Overall WFL for tasks assessed  Cervical / Trunk Assessment  Cervical / Trunk Assessment Normal  Transfers  General transfer comment Standing when PT entered room  Ambulation/Gait  Ambulation/Gait assistance Supervision  Ambulation Distance (Feet) 225 Feet  Assistive device None  Gait Pattern/deviations Step-through pattern;Decreased step length - left;Decreased stance time - right;Decreased stride length;Antalgic  General Gait Details Mildly antalgic, favoring left side. No overt buckling noted. Cues for safety and awareness, focusing on symmetry of gait.  Gait velocity decreased  Gait velocity interpretation Below normal speed for age/gender  Stairs Yes  Stairs assistance Supervision  Stair Management One rail Right;Step to pattern;Sideways;Forwards  Number of Stairs 12  General stair comments Educated on safe stair navigation, using forward and sideways techniques. Supervision for safety. No issues identified.  Balance  Overall balance assessment Modified Independent  General Comments  General comments (skin integrity, edema, etc.) Husband present, very supportive.  PT - End of Session  Equipment Utilized During Treatment Back brace  Activity Tolerance Patient tolerated treatment well  Patient left with call bell/phone within reach;with family/visitor present  Nurse Communication Mobility status;Patient requests pain meds  PT Assessment  PT Therapy Diagnosis  Abnormality of gait;Acute pain  PT Recommendation/Assessment Patent does not need any further PT services  No Skilled PT All education completed;Patient will have necessary level of assist by caregiver at discharge;Patient is supervision for all  activity/mobility  PT Recommendation  Follow Up Recommendations No PT follow up  PT equipment None recommended by PT  Acute Rehab PT Goals  Patient Stated Goal to go  home today (Simultaneous filing. User may not have seen previous data.)  PT Goal Formulation All assessment and education complete, DC therapy  PT Time Calculation  PT Start Time (ACUTE ONLY) 0849  PT Stop Time (ACUTE ONLY) 0903  PT Time Calculation (min) (ACUTE ONLY) 14 min  PT General Charges  $$ ACUTE PT VISIT 1 Procedure  PT Evaluation  $PT Eval Low Complexity 1 Procedure  Written Expression  Dominant Hand Right    Charlsie Merles, PT [phone]

## 2016-01-27 NOTE — Progress Notes (Signed)
Patient alert and oriented, mae's well, voiding adequate amount of urine, swallowing without difficulty, no c/o pain. Patient discharged home with family. Script and discharged instructions given to patient. Patient and family stated understanding of d/c instructions given and has an appointment with MD. 

## 2016-01-27 NOTE — Discharge Summary (Signed)
Physician Discharge Summary  Patient ID: Catherine Grant MRN: [id number] DOB/AGE: Aug 16, 1961 55 y.o.  Admit date: 01/26/2016 Discharge date: 01/27/2016  Admission Diagnoses: spondylolisthesis l4-5    Discharge Diagnoses: same   Discharged Condition: good  Hospital Course: The patient was admitted on 01/26/2016 and taken to the operating room where the patient underwent TLIF L4-5. The patient tolerated the procedure well and was taken to the recovery room and then to the floor in stable condition. The hospital course was routine. There were no complications. The wound remained clean dry and intact. Pt had appropriate back soreness. No complaints of leg pain or new N/T/W. The patient remained afebrile with stable vital signs, and tolerated a regular diet. The patient continued to increase activities, and pain was well controlled with oral pain medications.   Consults: None  Significant Diagnostic Studies:  Results for orders placed or performed during the hospital encounter of 01/19/16  Surgical pcr screen  Result Value Ref Range   MRSA, PCR NEGATIVE NEGATIVE   Staphylococcus aureus POSITIVE (A) NEGATIVE  Basic metabolic panel  Result Value Ref Range   Sodium 143 135 - 145 mmol/L   Potassium 4.5 3.5 - 5.1 mmol/L   Chloride 111 101 - 111 mmol/L   CO2 23 22 - 32 mmol/L   Glucose, Bld 78 65 - 99 mg/dL   BUN 14 6 - 20 mg/dL   Creatinine, Ser 9.621.12 (H) 0.44 - 1.00 mg/dL   Calcium 9.6 8.9 - 95.210.3 mg/dL   GFR calc non Af Amer 55 (L) >60 mL/min   GFR calc Af Amer >60 >60 mL/min   Anion gap 9 5 - 15  CBC WITH DIFFERENTIAL  Result Value Ref Range   WBC 4.6 4.0 - 10.5 K/uL   RBC 4.24 3.87 - 5.11 MIL/uL   Hemoglobin 12.6 12.0 - 15.0 g/dL   HCT 84.140.5 32.436.0 - 40.146.0 %   MCV 95.5 78.0 - 100.0 fL   MCH 29.7 26.0 - 34.0 pg   MCHC 31.1 30.0 - 36.0 g/dL   RDW 02.713.3 25.311.5 - 66.415.5 %   Platelets 241 150 - 400 K/uL   Neutrophils Relative % 52 %   Neutro Abs 2.4 1.7 - 7.7 K/uL   Lymphocytes Relative  39 %   Lymphs Abs 1.8 0.7 - 4.0 K/uL   Monocytes Relative 7 %   Monocytes Absolute 0.3 0.1 - 1.0 K/uL   Eosinophils Relative 2 %   Eosinophils Absolute 0.1 0.0 - 0.7 K/uL   Basophils Relative 0 %   Basophils Absolute 0.0 0.0 - 0.1 K/uL  Protime-INR  Result Value Ref Range   Prothrombin Time 13.0 11.6 - 15.2 seconds   INR 0.96 0.00 - 1.49  Type and screen [facility]  Result Value Ref Range   ABO/RH(D) O POS    Antibody Screen NEG    Sample Expiration 02/02/2016    Extend sample reason NO TRANSFUSIONS OR PREGNANCY IN THE PAST 3 MONTHS   ABO/Rh  Result Value Ref Range   ABO/RH(D) O POS     Chest 2 View  01/19/2016  CLINICAL DATA:  Spondylolisthesis.  Lumbar surgery. EXAM: CHEST  2 VIEW COMPARISON:  No recent prior. FINDINGS: The heart size and mediastinal contours are within normal limits. Both lungs are clear. The visualized skeletal structures are unremarkable. IMPRESSION: No active cardiopulmonary disease. Electronically Signed   By: Maisie Fushomas  Register   On: 01/19/2016 13:11   Dg Lumbar Spine 2-3 Views  01/26/2016  CLINICAL DATA:  55 year old female undergoing lumbar spine surgery. Initial encounter. EXAM: LUMBAR SPINE - 2-3 VIEW; DG C-ARM 61-120 MIN COMPARISON:  Outside lumbar MRI 12/01/2015. FINDINGS: Lumbar segmentation appears to be normal on the comparison MRI and will be designated as such for this report. Five intraoperative fluoroscopic spot films of the lower lumbar spine. These images demonstrate posterior and interbody fusion hardware placement at L4-L5 with decompression. IMPRESSION: L4-L5 decompression and fusion depicted. Electronically Signed   By: Odessa Fleming M.D.   On: 01/26/2016 10:26   Dg C-arm 1-60 Min  01/26/2016  CLINICAL DATA:  55 year old female undergoing lumbar spine surgery. Initial encounter. EXAM: LUMBAR SPINE - 2-3 VIEW; DG C-ARM 61-120 MIN COMPARISON:  Outside lumbar MRI 12/01/2015. FINDINGS: Lumbar segmentation appears to be normal on the  comparison MRI and will be designated as such for this report. Five intraoperative fluoroscopic spot films of the lower lumbar spine. These images demonstrate posterior and interbody fusion hardware placement at L4-L5 with decompression. IMPRESSION: L4-L5 decompression and fusion depicted. Electronically Signed   By: Odessa Fleming M.D.   On: 01/26/2016 10:26    Antibiotics:  Anti-infectives    Start     Dose/Rate Route Frequency Ordered Stop   01/26/16 1600  ceFAZolin (ANCEF) IVPB 1 g/50 mL premix     1 g 100 mL/hr over 30 Minutes Intravenous Every 8 hours 01/26/16 1032 01/26/16 2325   01/26/16 0819  bacitracin 50,000 Units in sodium chloride irrigation 0.9 % 500 mL irrigation  Status:  Discontinued       As needed 01/26/16 0819 01/26/16 1033   01/26/16 0700  ceFAZolin (ANCEF) IVPB 2g/100 mL premix     2 g 200 mL/hr over 30 Minutes Intravenous To [facility] 01/25/16 1134 01/26/16 0809      Discharge Exam: Blood pressure 103/61, pulse 54, temperature 99 F (37.2 C), temperature source Oral, resp. rate 18, height  (1.6 m), weight 73.086 kg (161 lb 2 oz), SpO2 99 %. Neurologic: Grossly normal Dressing dry  Discharge Medications:     Medication List    TAKE these medications        gabapentin 300 MG capsule  Commonly known as:  NEURONTIN  Take 300-600 mg by mouth 2 (two) times daily. Take 1 capsule in the morning Take 2 capsules at night     HYDROcodone-acetaminophen 5-325 MG tablet  Commonly known as:  NORCO/VICODIN  Take 2 tablets by mouth every 4 (four) hours as needed.     methocarbamol 500 MG tablet  Commonly known as:  ROBAXIN  Take 1 tablet (500 mg total) by mouth every 6 (six) hours as needed for muscle spasms.     predniSONE 10 MG tablet  Commonly known as:  DELTASONE  6,5,4,3,2,1 taper     ZOCOR PO  Take by mouth.     simvastatin 80 MG tablet  Commonly known as:  ZOCOR  Take 40 mg by mouth daily.        Disposition: home   Final Dx: TLIF L4-5       Discharge Instructions     Remove dressing in 72 hours    Complete by:  As directed      Call MD for:  difficulty breathing, headache or visual disturbances    Complete by:  As directed      Call MD for:  persistant nausea and vomiting    Complete by:  As directed      Call MD for:  redness, tenderness, or signs of  infection (pain, swelling, redness, odor or green/yellow discharge around incision site)    Complete by:  As directed      Call MD for:  severe uncontrolled pain    Complete by:  As directed      Call MD for:  temperature >100.4    Complete by:  As directed      Diet - low sodium heart healthy    Complete by:  As directed      Discharge instructions    Complete by:  As directed   No driving, no bending or twisting, no heavy lifting     Increase activity slowly    Complete by:  As directed               Signed: JONES,DAVID S 01/27/2016, 7:27 AM

## 2016-01-30 ENCOUNTER — Encounter (HOSPITAL_COMMUNITY): Payer: Self-pay | Admitting: Neurological Surgery

## 2016-06-27 ENCOUNTER — Encounter: Payer: Self-pay | Admitting: Physical Therapy

## 2016-06-27 ENCOUNTER — Ambulatory Visit (INDEPENDENT_AMBULATORY_CARE_PROVIDER_SITE_OTHER): Admitting: Physical Therapy

## 2016-06-27 DIAGNOSIS — R2689 Other abnormalities of gait and mobility: Secondary | ICD-10-CM

## 2016-06-27 DIAGNOSIS — M6281 Muscle weakness (generalized): Secondary | ICD-10-CM

## 2016-06-27 DIAGNOSIS — M5441 Lumbago with sciatica, right side: Secondary | ICD-10-CM | POA: Diagnosis not present

## 2016-06-27 NOTE — Therapy (Signed)
[facility] [place] [address]  [address] [place], [place], [address] Phone: [phone]   Fax:  [phone]  Physical Therapy Evaluation  Patient Details  Name: Catherine Grant MRN: [id number] Date of Birth: [birth year] Referring Provider: Dr Marikay Alar  Encounter Date: 06/27/2016      PT End of Session - 06/27/16 1430    Visit Number 1   Number of Visits 16   Date for PT Re-Evaluation 08/22/16   PT Start Time 1430   PT Stop Time 1541   PT Time Calculation (min) 71 min   Activity Tolerance Patient tolerated treatment well      Past Medical History:  Diagnosis Date  . Hyperlipidemia   . Pneumohemothorax    15 - 20  YRS AGO   CT PLACED  . PONV (postoperative nausea and vomiting)     Past Surgical History:  Procedure Laterality Date  . ABDOMINAL HYSTERECTOMY     partial  . BREAST LUMPECTOMY     RT      1981   . CHEST TUBE INSERTION    . TRANSFORAMINAL LUMBAR INTERBODY FUSION (TLIF) WITH PEDICLE SCREW FIXATION 1 LEVEL N/A 01/26/2016   Procedure: L4-5 TRANSFORAMINAL LUMBAR INTERBODY FUSION (TLIF) WITH PEDICLE SCREW FIXATION ;  Surgeon: Tia Alert, MD;  Location: [facility];  Service: Neurosurgery;  Laterality: N/A;  L4-5 TRANSFORAMINAL LUMBAR INTERBODY FUSION (TLIF) WITH PEDICLE SCREW FIXATION     There were no vitals filed for this visit.       Subjective Assessment - 06/27/16 1430    Subjective Pt reports she had back surgery/fusion in March due to Rt LE pain/radiculopathy, She was discaharged home without any assistance. Since then the more she walks the more pain she has. Has been using recumbent bike tolerates 30-35 minutes with resistance ( has pain)    Pertinent History currently bilat plantar fascitis - stretching, wearing orthotics.    How long can you sit comfortably? will have pain from back to ankle with watching TV sometimes.    How long can you walk comfortably? trying to walk in the neighborhood    Patient  Stated Goals keep toes from curling and walk again normal.  Used to run, not sure if she will again   Currently in Pain? Yes   Pain Score 3   5/10 pain with weightbearing   Pain Location Back   Pain Orientation Right;Lower   Pain Descriptors / Indicators Sharp;Shooting;Squeezing  pincing at rest, sharp with weightbearing   Pain Type Chronic pain   Pain Radiating Towards to the Rt heel at times   Pain Onset More than a month ago   Pain Frequency Constant   Aggravating Factors  walking, standing  prolonged   Pain Relieving Factors gentle limited movement.             [facility] PT Assessment - 06/27/16 0001      Assessment   Medical Diagnosis lumbar radiculopathy   Referring Provider Dr Marikay Alar   Onset Date/Surgical Date 01/26/16   Hand Dominance Right   Next MD Visit 07/25/16   Prior Therapy none     Precautions   Precautions None  been cleared from precautions   Required Braces or Orthoses --  back brace PRN      Balance Screen   Has the patient fallen in the past 6 months No  she does wobble and her ankle twists   Has the patient had a decrease in activity level because  of a fear of falling?  No   Is the patient reluctant to leave their home because of a fear of falling?  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[employer], out of work now   Leisure exercise     Observation/Other Assessments   Focus on Therapeutic Outcomes (FOTO)  59% limited     Functional Tests   Functional tests Squat;Single leg stance     Squat   Comments WNL     Single Leg Stance   Comments Rt 3 sec with tremurs, Lt > 12 sec     Posture/Postural Control   Posture/Postural Control Postural limitations   Postural Limitations Rounded Shoulders;Forward  head;Increased lumbar lordosis     Tone   Assessment Location Right Lower Extremity  First and second Rt toes curl under     ROM / Strength   AROM / PROM / Strength AROM;Strength     AROM   AROM Assessment Site --     Strength   Strength Assessment Site Lumbar;Hip;Knee;Ankle   Right/Left Hip Right  Lt WNL   Right Hip Flexion 4/5   Right Hip Extension 4-/5   Right Hip ABduction 3+/5   Right/Left Knee Right  Lt WNL   Right Knee Flexion 4+/5   Right Knee Extension 4/5   Right/Left Ankle Right  Lt WNL   Right Ankle Dorsiflexion 4+/5   Right Ankle Plantar Flexion 4/5   Right Ankle Inversion 4/5   Right Ankle Eversion 5/5   Lumbar Flexion --  transverse abdomins poor    Lumbar Extension --  multifidi fair `     Flexibility   Soft Tissue Assessment /Muscle Length yes   Hamstrings WNl   Quadriceps WNL   Piriformis tight bilat   Quadratus Lumborum tight Rt      Palpation   Palpation comment tight and tender in bilat gluts, Rt QL and piriformis      Ambulation/Gait   Ambulation/Gait Yes   Ambulation/Gait Assistance 7: Independent   Ambulation Distance (Feet) --  observed in gym   Assistive device None  however pt would benefit from a cane - she is not interested   Gait Pattern Decreased stance time - right;Decreased step length - right;Decreased stride length;Right hip hike;Right steppage;Right genu recurvatum;Ataxic;Lateral hip instability  tremurs with WBing in Rt LE     RLE Tone   RLE Tone --  tremurs with movement                   [facility] Adult PT Treatment/Exercise - 06/27/16 0001      Exercises   Exercises Lumbar     Lumbar Exercises: Stretches   Single Knee to Chest Stretch 30 seconds   Lower Trunk Rotation --  10 reps   ITB Stretch 1 rep;20 seconds     Lumbar Exercises: Supine   Ab Set 10 reps  VC   Clam 10 reps  VC   Bent Knee Raise 10 reps  VC                PT Education - 06/27/16 1524    Education provided Yes    Education Details HEP and TENS   Person(s) Educated  Patient   Methods Demonstration;Explanation;Handout   Comprehension Returned demonstration;Verbalized understanding;Need further instruction          PT Short Term Goals - 06/27/16 1704      PT SHORT TERM GOAL #1   Title I with initial HEP ( 07/23/16   Time 4   Period Weeks   Status New     PT SHORT TERM GOAL #2   Title demo Rt hip strength =/> 4+/5 throughout ( 07/23/16)    Time 4   Period Weeks   Status New     PT SHORT TERM GOAL #3   Title improve FOTO =/< 49% limited ( 07/23/16)    Time 4   Period Weeks   Status New           PT Long Term Goals - 06/27/16 1705      PT LONG TERM GOAL #1   Title I with advanced HEP ( 08/22/16)    Time 8   Period Weeks   Status New     PT LONG TERM GOAL #2   Title demo Rt knee and ankle strength =/> 5-/5 ( 08/22/16)    Time 8   Period Weeks   Status New     PT LONG TERM GOAL #3   Title report overall pain reduction =/> 75% in Rt LE ( 08/22/16)    Time 8   Period Weeks   Status New     PT LONG TERM GOAL #4   Title ambulate on even /uneven surface without obvious gait deviations ( 08/22/16)    Time 8   Period Weeks   Status New     PT LONG TERM GOAL #5   Title improve FOTO =/< 39% limited ( 08/22/16)    Time 8   Period Weeks   Status New               Plan - 06/27/16 1532    Clinical Impression Statement 55 yo female 5 months s/p L4-5 fusion due to radiculopathy into the Rt LE.  She has continued to have pain in her back, Rt buttocks and Rt LE. She has tremers with walking, weakness in her core and LE along with tightness in her low back and buttucks.    Rehab Potential Good   PT Frequency 2x / week   PT Duration 8 weeks   PT Treatment/Interventions Moist Heat;Ultrasound;Therapeutic exercise;Dry needling;Taping;Balance training;Neuromuscular re-education;Cryotherapy;Gait training;Electrical Stimulation;Patient/family education   PT Next Visit Plan progress  core and LE strengthening   Consulted and Agree with Plan of Care Patient      Patient will benefit from skilled therapeutic intervention in order to improve the following deficits and impairments:  Abnormal gait, Difficulty walking, Increased muscle spasms, Pain, Improper body mechanics, Decreased strength  Visit Diagnosis: Lumbago with sciatica, right side - Plan: PT plan of care cert/re-cert  Muscle weakness (generalized) - Plan: PT plan of care cert/re-cert  Other abnormalities of gait and mobility - Plan: PT plan of care cert/re-cert     Problem List Patient Active Problem List   Diagnosis Date Noted  . S/P lumbar spinal fusion 01/26/2016    Roderic Scarce PT  06/27/2016, 5:09 PM  [facility] [address] [place] [address] [place], [place], [address] Phone: [phone]   Fax:  [phone]  Name: Catherine Grant MRN: [id number] Date of Birth: [birth year]

## 2016-06-27 NOTE — Patient Instructions (Addendum)
Knee-to-Chest Stretch: Unilateral     [place] [phone]     With hand behind right knee, pull knee in to chest until a comfortable stretch is felt in lower back and buttocks. Keep back relaxed. Hold _30___ seconds. Repeat __1__ times per set. Do __1__ sets per session. Do __1__ sessions per day.  Lumbar Rotation (Non-Weight Bearing)    Feet on floor, slowly rock knees from side to side in small, pain-free range of motion. Allow lower back to rotate slightly. Repeat _10__ times per set. Do ___1_ sets per session. Do __1__ sessions per day.  Lumbar Rotation Stretch    Lie on back with left knee drawn toward chest. Slowly bring bent leg across body until stretch is felt in lower back/hip area. Hold __20-30__ seconds. Repeat _1___ times per set. Do _1___ sets per session. Do __1__ sessions per day. Repeat on the other side.   Abdominal Bracing With Pelvic Floor (Hook-Lying)   With neutral spine, tighten pelvic floor and abdominals. Hold 5 seconds. Repeat __10_ times. Do _1__ times a day.  Knee to Chest: Transverse Plane Stability   Bring one knee up, then return. Be sure pelvis does not roll side to side. Keep pelvis still. Lift knee __10_ times each leg. Restabilize pelvis. Repeat with other leg. Do _1-2__ sets, _1__ times per day.  Hip External Rotation With Pillow: Transverse Plane Stability   One knee bent, one leg straight, on pillow. Slowly roll bent knee out. Be sure pelvis does not rotate. Do _10__ times. Restabilize pelvis. Repeat with other leg. Do _1-2__ sets, _1__ times per day.  Pelvic Press    Place hands under belly between navel and pubic bone, palms up. Feel pressure on hands. Increase pressure on hands by pressing pelvis down. This is NOT a pelvic tilt. Hold _5__ seconds. Relax. Repeat _10__ times.   Leg Lift: One-Leg    Press pelvis down. Keep knee straight; lengthen and lift one leg (from waist). Do not twist body. Keep other leg down. Hold __1-2_  seconds. Relax. Repeat 10 times. Repeat with other leg. Once day    TENS UNIT: This is helpful for muscle pain and spasm.   Search and Purchase a TENS 7000 2nd edition at www.tenspros.com. It should be less than $30.     TENS unit instructions: Do not shower or bathe with the unit on Turn the unit off before removing electrodes or batteries If the electrodes lose stickiness add a drop of water to the electrodes after they are disconnected from the unit and place on plastic sheet. If you continued to have difficulty, call the TENS unit company to purchase more electrodes. Do not apply lotion on the skin area prior to use. Make sure the skin is clean and dry as this will help prolong the life of the electrodes. After use, always check skin for unusual red areas, rash or other skin difficulties. If there are any skin problems, does not apply electrodes to the same area. Never remove the electrodes from the unit by pulling the wires. Do not use the TENS unit or electrodes other than as directed. Do not change electrode placement without consultating your therapist or physician. Keep 2 fingers with between each electrode. Wear time ratio is 2:1, on to off times.    For example on for 30 minutes off for 15 minutes and then on for 30 minutes off for 15 minutes

## 2016-06-29 ENCOUNTER — Encounter (INDEPENDENT_AMBULATORY_CARE_PROVIDER_SITE_OTHER): Payer: Self-pay

## 2016-06-29 ENCOUNTER — Encounter: Payer: Self-pay | Admitting: Rehabilitative and Restorative Service Providers"

## 2016-06-29 ENCOUNTER — Ambulatory Visit (INDEPENDENT_AMBULATORY_CARE_PROVIDER_SITE_OTHER): Admitting: Rehabilitative and Restorative Service Providers"

## 2016-06-29 DIAGNOSIS — R2689 Other abnormalities of gait and mobility: Secondary | ICD-10-CM

## 2016-06-29 DIAGNOSIS — M6281 Muscle weakness (generalized): Secondary | ICD-10-CM | POA: Diagnosis not present

## 2016-06-29 DIAGNOSIS — M5441 Lumbago with sciatica, right side: Secondary | ICD-10-CM | POA: Diagnosis not present

## 2016-06-29 NOTE — Patient Instructions (Addendum)
Bridging    Slowly raise buttocks from floor, keeping stomach tight. Hold 3-5 sec Repeat __10__ times per set. Do __1-2__ sets per session. Do __1__ sessions per day.   Bent Knee Lift (Prone)    Abdomen and head supported, bend left knee and slowly raise hip. Repeat with right knee. Avoid arching low back. Repeat _10___ times per set. Do __1__ sets per session. Do __1__ sessions per day.   Piriformis Stretch   Lying on back, pull right knee toward opposite shoulder. Hold 30 seconds. Repeat 3 times. Do 2-3 sessions per day.

## 2016-06-29 NOTE — Therapy (Signed)
[facility] [place] [address] [place] [address] [place], [place], [address] Phone: [phone]   Fax:  [phone]  Physical Therapy Treatment  Patient Details  Name: Catherine Grant MRN: [id number] Date of Birth: [birth year] Referring Provider: Dr Marikay Alar  Encounter Date: 06/29/2016      PT End of Session - 06/29/16 1335    Visit Number 2   Number of Visits 16   Date for PT Re-Evaluation 08/22/16   PT Start Time 1332   PT Stop Time 1424   PT Time Calculation (min) 52 min   Activity Tolerance Patient tolerated treatment well      Past Medical History:  Diagnosis Date  . Hyperlipidemia   . Pneumohemothorax    15 - 20  YRS AGO   CT PLACED  . PONV (postoperative nausea and vomiting)     Past Surgical History:  Procedure Laterality Date  . ABDOMINAL HYSTERECTOMY     partial  . BREAST LUMPECTOMY     RT      1981   . CHEST TUBE INSERTION    . TRANSFORAMINAL LUMBAR INTERBODY FUSION (TLIF) WITH PEDICLE SCREW FIXATION 1 LEVEL N/A 01/26/2016   Procedure: L4-5 TRANSFORAMINAL LUMBAR INTERBODY FUSION (TLIF) WITH PEDICLE SCREW FIXATION ;  Surgeon: Tia Alert, MD;  Location: [facility];  Service: Neurosurgery;  Laterality: N/A;  L4-5 TRANSFORAMINAL LUMBAR INTERBODY FUSION (TLIF) WITH PEDICLE SCREW FIXATION     There were no vitals filed for this visit.      Subjective Assessment - 06/29/16 1338    Subjective Has some questions about the initial exercises. Pain is worse today. The more she moves the worse the pain is.    Currently in Pain? Yes   Pain Score 6    Pain Location Back   Pain Orientation Right;Lower   Pain Descriptors / Indicators Sharp;Shooting;Squeezing   Pain Type Chronic pain   Pain Onset More than a month ago   Pain Frequency Constant                         [facility] Adult PT Treatment/Exercise - 06/29/16 0001      Lumbar Exercises: Stretches   Single Knee to Chest Stretch 30 seconds   Lower Trunk  Rotation --  10 reps   ITB Stretch 1 rep;20 seconds   Piriformis Stretch 3 reps;30 seconds     Lumbar Exercises: Aerobic   Stationary Bike Nustep L5 x 5 min      Lumbar Exercises: Supine   Ab Set 10 reps  VC   Clam 10 reps  VC   Bent Knee Raise 10 reps  VC   Bridge 10 reps;3 seconds     Lumbar Exercises: Prone   Other Prone Lumbar Exercises pelvic press 5 sec x 10; alt leg lift x 10      Moist Heat Therapy   Number Minutes Moist Heat 20 Minutes   Moist Heat Location Lumbar Spine;Hip  Rt     Electrical Stimulation   Electrical Stimulation Location lumbar spine; Rt hip    Electrical Stimulation Action IFC   Electrical Stimulation Parameters to tolerance   Electrical Stimulation Goals Pain;Tone                PT Education - 06/29/16 1340    Education provided Yes   Education Details HEP    Person(s) Educated Patient   Methods Explanation;Demonstration;Tactile cues;Verbal cues;Handout   Comprehension Verbalized understanding;Returned demonstration;Verbal cues  required;Tactile cues required;Need further instruction          PT Short Term Goals - 06/29/16 1405      PT SHORT TERM GOAL #1   Title I with initial HEP ( 07/23/16   Time 4   Period Weeks   Status On-going     PT SHORT TERM GOAL #2   Title demo Rt hip strength =/> 4+/5 throughout ( 07/23/16)    Time 4   Period Weeks   Status On-going     PT SHORT TERM GOAL #3   Title improve FOTO =/< 49% limited ( 07/23/16)    Time 4   Period Weeks   Status On-going           PT Long Term Goals - 06/29/16 1337      PT LONG TERM GOAL #1   Title I with advanced HEP ( 08/22/16)    Time 8   Period Weeks   Status On-going     PT LONG TERM GOAL #2   Title demo Rt knee and ankle strength =/> 5-/5 ( 08/22/16)    Time 8   Period Weeks   Status On-going     PT LONG TERM GOAL #3   Title report overall pain reduction =/> 75% in Rt LE ( 08/22/16)    Time 8   Period Weeks   Status On-going     PT LONG  TERM GOAL #4   Title ambulate on even /uneven surface without obvious gait deviations ( 08/22/16)    Time 8   Period Weeks   Status On-going     PT LONG TERM GOAL #5   Title improve FOTO =/< 39% limited ( 08/22/16)    Time 8   Status On-going               Plan - 06/29/16 1340    Clinical Impression Statement Contintued pain and limitations. Poor gait pattern - no assistive device. Pt does not want to use assistive device. More confident with HEP. Scheduled for spinal injectioin Tuesday 07/03/16.    Rehab Potential Good   PT Frequency 2x / week   PT Duration 8 weeks   PT Treatment/Interventions Moist Heat;Ultrasound;Therapeutic exercise;Dry needling;Taping;Balance training;Neuromuscular re-education;Cryotherapy;Gait training;Electrical Stimulation;Patient/family education   PT Next Visit Plan progress core and LE strengthening as tolerated       Patient will benefit from skilled therapeutic intervention in order to improve the following deficits and impairments:  Abnormal gait, Difficulty walking, Increased muscle spasms, Pain, Improper body mechanics, Decreased strength  Visit Diagnosis: Lumbago with sciatica, right side  Muscle weakness (generalized)  Other abnormalities of gait and mobility     Problem List Patient Active Problem List   Diagnosis Date Noted  . S/P lumbar spinal fusion 01/26/2016    Danh Bayus Rober MinionP Jeptha Hinnenkamp PT, MPH  06/29/2016, 2:06 PM  [facility] [address] [place] [address] [place], [place], [address] Phone: [phone]   Fax:  [phone]  Name: Catherine Grant MRN: [id number] Date of Birth: [birth year]

## 2016-07-06 ENCOUNTER — Encounter: Payer: Self-pay | Admitting: Rehabilitative and Restorative Service Providers"

## 2016-07-06 ENCOUNTER — Ambulatory Visit (INDEPENDENT_AMBULATORY_CARE_PROVIDER_SITE_OTHER): Admitting: Rehabilitative and Restorative Service Providers"

## 2016-07-06 DIAGNOSIS — M5441 Lumbago with sciatica, right side: Secondary | ICD-10-CM

## 2016-07-06 DIAGNOSIS — M6281 Muscle weakness (generalized): Secondary | ICD-10-CM | POA: Diagnosis not present

## 2016-07-06 DIAGNOSIS — R2689 Other abnormalities of gait and mobility: Secondary | ICD-10-CM | POA: Diagnosis not present

## 2016-07-06 NOTE — Patient Instructions (Signed)
Achilles / Gastroc, Standing    Stand, right foot behind, heel on floor and turned slightly out, leg straight, forward leg bent. Move hips forward. Hold _30__ seconds. Repeat _3__ times per session. Do _3-4__ sessions per day.  Achilles / Soleus, Standing    Stand, right foot behind, heel on floor and turned slightly out. Lower hips and bend knees. Hold _30__ seconds. Repeat __3_ times per session. Do _3-4__ sessions per day.   Functional Quadriceps: Sit to Stand SLOWLY   Sit on edge of chair, feet flat on floor. Stand upright, extending knees fully. Repeat _10___ times per set. Do _1-3___ sets per session. Do _2-3___ sessions per day.  Standing balance - working on standing with equal weight bearing on both legs. Engage your core! practice standing still without locking your knees and without clenching the toes of the Rt foot Work toward being able to stand for 3-5 min   Standing straight and tall with equal weight on both legs - engage core Shift weight to one leg and lift the opposite heel without moving your feet Move slowly  Work on shift for 1-3 min    Step one foot forward in mid stance Shift forward to the forward foot and let the back heel lift slightly off the floor Shift back to the back foot and let the front toe come slightly off the floor Repeat for ~1 min each foot forward

## 2016-07-06 NOTE — Therapy (Signed)
[facility] [address] [place], [place], [address] Phone: [phone]   Fax:  [phone]  Physical Therapy Treatment  Patient Details  Name: Catherine Grant MRN: [id number] Date of Birth: October 17, 1961 Referring Provider: Dr Marikay Alar  Encounter Date: 07/06/2016      PT End of Session - 07/06/16 1336    Visit Number 3   Number of Visits 16   Date for PT Re-Evaluation 08/22/16   PT Start Time 1332   PT Stop Time 1423   PT Time Calculation (min) 51 min   Equipment Utilized During Treatment Gait belt   Activity Tolerance Patient tolerated treatment well      Past Medical History:  Diagnosis Date  . Hyperlipidemia   . Pneumohemothorax    15 - 20  YRS AGO   CT PLACED  . PONV (postoperative nausea and vomiting)     Past Surgical History:  Procedure Laterality Date  . ABDOMINAL HYSTERECTOMY     partial  . BREAST LUMPECTOMY     RT      1981   . CHEST TUBE INSERTION    . TRANSFORAMINAL LUMBAR INTERBODY FUSION (TLIF) WITH PEDICLE SCREW FIXATION 1 LEVEL N/A 01/26/2016   Procedure: L4-5 TRANSFORAMINAL LUMBAR INTERBODY FUSION (TLIF) WITH PEDICLE SCREW FIXATION ;  Surgeon: Tia Alert, MD;  Location: [facility];  Service: Neurosurgery;  Laterality: N/A;  L4-5 TRANSFORAMINAL LUMBAR INTERBODY FUSION (TLIF) WITH PEDICLE SCREW FIXATION     There were no vitals filed for this visit.      Subjective Assessment - 07/06/16 1337    Subjective Had spinal injectioin Tuesday 07/03/16 which seemed to help a little tht day but it is now about the same. in general the pain is better since surgery but she just has difficulty walking. She has had multiple tests done. She can "jump around" on the Rt leg fine.     Currently in Pain? No/denies                         [facility] Adult PT Treatment/Exercise - 07/06/16 0001      Self-Care   Self-Care --  self massage w/ frozen h2o bottle/golf ball plantar surface     Lumbar Exercises: Aerobic   Stationary Bike Nustep L5 x 7 min      Lumbar Exercises: Standing   Other Standing Lumbar Exercises standing balance work with equal weight bearing; weight shift side to side; stager stance weight shift lifting heel/toe as weight is shifted 1-3 min each activity      Lumbar Exercises: Seated   Sit to Stand 15 reps     Moist Heat Therapy   Number Minutes Moist Heat 20 Minutes   Moist Heat Location Lumbar Spine;Hip  Rt     Electrical Stimulation   Electrical Stimulation Location lumbar spine; Rt hip    Electrical Stimulation Action IFC   Electrical Stimulation Parameters to tolerance   Electrical Stimulation Goals Pain;Tone     Ankle Exercises: Stretches   Soleus Stretch 3 reps;30 seconds   Gastroc Stretch 3 reps;30 seconds                PT Education - 07/06/16 1358    Education provided Yes   Education Details HEP    Person(s) Educated Patient   Methods Explanation;Demonstration;Tactile cues;Verbal cues;Handout   Comprehension Verbalized understanding;Returned demonstration;Verbal cues required;Tactile cues required          PT  Short Term Goals - 07/06/16 1552      PT SHORT TERM GOAL #1   Title I with initial HEP ( 07/23/16   Time 4   Period Weeks   Status On-going     PT SHORT TERM GOAL #2   Title demo Rt hip strength =/> 4+/5 throughout ( 07/23/16)    Time 4   Period Weeks   Status On-going     PT SHORT TERM GOAL #3   Title improve FOTO =/< 49% limited ( 07/23/16)    Time 4   Period Weeks   Status On-going           PT Long Term Goals - 07/06/16 1553      PT LONG TERM GOAL #1   Title I with advanced HEP ( 08/22/16)    Time 8   Period Weeks   Status On-going     PT LONG TERM GOAL #2   Title demo Rt knee and ankle strength =/> 5-/5 ( 08/22/16)    Time 8   Period Weeks   Status On-going     PT LONG TERM GOAL #3   Title report overall pain reduction =/> 75% in Rt LE ( 08/22/16)    Time 8   Period Weeks    Status On-going     PT LONG TERM GOAL #4   Title ambulate on even /uneven surface without obvious gait deviations ( 08/22/16)    Time 8   Period Weeks   Status On-going     PT LONG TERM GOAL #5   Title improve FOTO =/< 39% limited ( 08/22/16)    Time 8   Period Weeks   Status On-going               Plan - 07/06/16 1548    Clinical Impression Statement Continued pain on an intermittent basis; difficulty with gait. Minimal changes reported with spinal injection. Noted tremor and difficulty with control through Rt LE with functional exercises and activities which seem more neurological in nature than orthopedic. Patient has order for PT for plantar fasciitis which will be scheduled ASAP. Provided pt with stretching for Rt gastroc/soleus and myofacial release work for plantar surface to begin with HEP.    Rehab Potential Good   PT Frequency 2x / week   PT Duration 8 weeks   PT Treatment/Interventions Moist Heat;Ultrasound;Therapeutic exercise;Dry needling;Taping;Balance training;Neuromuscular re-education;Cryotherapy;Gait training;Electrical Stimulation;Patient/family education   PT Next Visit Plan progress core and LE strengthening as tolerated. Assess plantar fasciitis as scheduled    Consulted and Agree with Plan of Care Patient      Patient will benefit from skilled therapeutic intervention in order to improve the following deficits and impairments:  Abnormal gait, Difficulty walking, Increased muscle spasms, Pain, Improper body mechanics, Decreased strength  Visit Diagnosis: Lumbago with sciatica, right side  Muscle weakness (generalized)  Other abnormalities of gait and mobility     Problem List Patient Active Problem List   Diagnosis Date Noted  . S/P lumbar spinal fusion 01/26/2016    Catherine Grant Rober MinionP Shyler Holzman PT, MPH  07/06/2016, 3:54 PM  [facility] [address] [place], [place], [address] Phone: [phone]    Fax:  [phone]  Name: Catherine Grant MRN: [id number] Date of Birth: 02/06/1961

## 2016-07-11 ENCOUNTER — Encounter: Payer: Self-pay | Admitting: Physical Therapy

## 2016-07-11 ENCOUNTER — Ambulatory Visit (INDEPENDENT_AMBULATORY_CARE_PROVIDER_SITE_OTHER): Admitting: Physical Therapy

## 2016-07-11 DIAGNOSIS — M6281 Muscle weakness (generalized): Secondary | ICD-10-CM | POA: Diagnosis not present

## 2016-07-11 DIAGNOSIS — M5441 Lumbago with sciatica, right side: Secondary | ICD-10-CM | POA: Diagnosis not present

## 2016-07-11 DIAGNOSIS — R2689 Other abnormalities of gait and mobility: Secondary | ICD-10-CM | POA: Diagnosis not present

## 2016-07-11 DIAGNOSIS — M79671 Pain in right foot: Secondary | ICD-10-CM

## 2016-07-11 NOTE — Therapy (Signed)
[place], [place], [address] Phone: [phone]   Fax:  [phone]  Physical Therapy Treatment  Patient Details  Name: Catherine Grant MRN: [id number] Date of Birth: 04-29-1961 Referring Provider: Dr Sherley Bounds  Encounter Date: 07/11/2016      PT End of Session - 07/11/16 1108    Visit Number 4   Number of Visits 16   Date for PT Re-Evaluation 08/22/16   PT Start Time 1108   PT Stop Time (P)  1206   PT Time Calculation (min) (P)  58 min      Past Medical History:  Diagnosis Date  . Hyperlipidemia   . Pneumohemothorax    15 - 20  YRS AGO   CT PLACED  . PONV (postoperative nausea and vomiting)     Past Surgical History:  Procedure Laterality Date  . ABDOMINAL HYSTERECTOMY     partial  . BREAST LUMPECTOMY     RT      1981   . CHEST TUBE INSERTION    . TRANSFORAMINAL LUMBAR INTERBODY FUSION (TLIF) WITH PEDICLE SCREW FIXATION 1 LEVEL N/A 01/26/2016   Procedure: L4-5 TRANSFORAMINAL LUMBAR INTERBODY FUSION (TLIF) WITH PEDICLE SCREW FIXATION ;  Surgeon: Eustace Moore, MD;  Location: [place] [facility];  Service: Neurosurgery;  Laterality: N/A;  L4-5 TRANSFORAMINAL LUMBAR INTERBODY FUSION (TLIF) WITH PEDICLE SCREW FIXATION     There were no vitals filed for this visit.      Subjective Assessment - 07/11/16 1109    Subjective Pt reports good days and bad days with her function/walking, rode the bike for 17mn this AM , has pain with some of the back motions.    Currently in Pain? Yes   Pain Score 4    Pain Location Back   Pain Orientation Right;Lower   Pain Descriptors / Indicators Aching   Pain Type Chronic pain   Pain Onset More than a month ago   Pain Frequency Intermittent   Aggravating Factors  movement   Pain Relieving Factors gentle mov't             [facility] PT Assessment - 07/11/16 0001      Assessment   Medical Diagnosis lumbar radiculopathy   Referring Provider Dr DSherley Bounds  Onset Date/Surgical Date 01/26/16   Hand Dominance Right   Next MD Visit 07/25/16     ROM / Strength   AROM / PROM / Strength AROM     AROM   AROM Assessment Site Ankle   Right/Left Ankle Right   Right Ankle Dorsiflexion 7   Right Ankle Plantar Flexion --  WNL   Right Ankle Inversion 32   Right Ankle Eversion 12  30 passively      Strength   Right Hip Extension 4+/5   Right Hip ABduction 4/5                     [facility] Adult PT Treatment/Exercise - 07/11/16 0001      Exercises   Exercises Lumbar;Ankle     Lumbar Exercises: Stretches   ITB Stretch 3 reps;30 seconds  cross body with strap   Piriformis Stretch 1 rep;30 seconds  rt     Lumbar Exercises: Seated   Long Arc Quad on Chair Strengthening;Both;10 reps  3 sec hold sitting on blue disc   Other Seated Lumbar Exercises on blue disc BWD leans lifting 2# wts, 2x10     Lumbar Exercises: Supine  Bridge 10 reps  with marching   Isometric Hip Flexion 10 reps;5 seconds  ipsolateral and contralateral     Lumbar Exercises: Sidelying   Clam 15 reps  3x10 reverse  Rt LE     Lumbar Exercises: Prone   Other Prone Lumbar Exercises reviewed pelvic press with hip ext, VC for form.    Other Prone Lumbar Exercises 10 reps upper body lifts     Moist Heat Therapy   Number Minutes Moist Heat 20 Minutes   Moist Heat Location Lumbar Spine;Hip     Electrical Stimulation   Electrical Stimulation Location lumbar spine; Rt hip    Electrical Stimulation Action IFC   Electrical Stimulation Parameters to tolerance   Electrical Stimulation Goals Pain;Tone     Ankle Exercises: Stretches   Gastroc Stretch 2 reps;30 seconds  each side     Ankle Exercises: Supine   T-Band 2x10, red band, DF, eversion                  PT Short Term Goals - 07/11/16 1113      PT SHORT TERM GOAL #1   Title I with initial HEP ( 07/23/16   Status Achieved     PT SHORT TERM GOAL #2   Title demo Rt hip strength =/>  4+/5 throughout ( 07/23/16)    Status Partially Met     PT SHORT TERM GOAL #3   Title improve FOTO =/< 49% limited ( 07/23/16)    Status On-going           PT Long Term Goals - 07/11/16 1117      PT LONG TERM GOAL #1   Title I with advanced HEP ( 08/22/16)    Status On-going     PT LONG TERM GOAL #2   Title demo Rt knee and ankle strength =/> 5-/5 ( 08/22/16)    Status On-going     PT LONG TERM GOAL #3   Title report overall pain reduction =/> 75% in Rt LE ( 08/22/16)    Status On-going     PT LONG TERM GOAL #4   Title ambulate on even /uneven surface without obvious gait deviations ( 08/22/16)    Status On-going     PT LONG TERM GOAL #5   Title improve FOTO =/< 39% limited ( 08/22/16)    Status On-going     Additional Long Term Goals   Additional Long Term Goals Yes     PT LONG TERM GOAL #6   Title improve Rt ankle dorsiflexion =/> 12 degrees and eversion =/> 20 degrees ( 08/22/16)    Time 6   Period Weeks   Status New     PT LONG TERM GOAL #7   Title report Rt foot pain decrease =/> 75% with walking ( 08/22/16)    Time 6   Period Weeks   Status New               Plan - 07/11/16 1139    Clinical Impression Statement Pt is making slow progress, met one STG and progressing to the others.  Continues to demo neurologic type symptoms in her Rt LE inconsistent with what is usually seen after back surgery.  Goals added to address her foot/plantar fascitis.    Rehab Potential Good   PT Frequency 2x / week   PT Duration 8 weeks   PT Treatment/Interventions Moist Heat;Ultrasound;Therapeutic exercise;Dry needling;Taping;Balance training;Neuromuscular re-education;Cryotherapy;Gait training;Electrical Stimulation;Patient/family education   PT Next Visit Plan possible  trial of TDN to Rt gluts/low back   Consulted and Agree with Plan of Care Patient      Patient will benefit from skilled therapeutic intervention in order to improve the following deficits and  impairments:  Abnormal gait, Difficulty walking, Increased muscle spasms, Pain, Improper body mechanics, Decreased strength  Visit Diagnosis: Lumbago with sciatica, right side  Muscle weakness (generalized)  Other abnormalities of gait and mobility  Pain in right foot     Problem List Patient Active Problem List   Diagnosis Date Noted  . S/P lumbar spinal fusion 01/26/2016    Jeral Pinch PT  07/11/2016, 2:04 PM  [facility] [address] [place], [place], [address] Phone: [phone]   Fax:  [phone]  Name: Catherine Grant MRN: [id number] Date of Birth: 08/27/1961

## 2016-07-13 ENCOUNTER — Encounter: Payer: Self-pay | Admitting: Rehabilitative and Restorative Service Providers"

## 2016-07-13 ENCOUNTER — Ambulatory Visit (INDEPENDENT_AMBULATORY_CARE_PROVIDER_SITE_OTHER): Admitting: Rehabilitative and Restorative Service Providers"

## 2016-07-13 DIAGNOSIS — M6281 Muscle weakness (generalized): Secondary | ICD-10-CM | POA: Diagnosis not present

## 2016-07-13 DIAGNOSIS — M5441 Lumbago with sciatica, right side: Secondary | ICD-10-CM | POA: Diagnosis not present

## 2016-07-13 DIAGNOSIS — R2689 Other abnormalities of gait and mobility: Secondary | ICD-10-CM

## 2016-07-13 DIAGNOSIS — M79671 Pain in right foot: Secondary | ICD-10-CM

## 2016-07-13 NOTE — Patient Instructions (Signed)

## 2016-07-13 NOTE — Therapy (Signed)
[place], [place], [address] Phone: [phone]   Fax:  [phone]  Physical Therapy Treatment  Patient Details  Name: Catherine Grant MRN: [id number] Date of Birth: 03-Apr-1961 Referring Provider: Dr Sherley Bounds  Encounter Date: 07/13/2016      PT End of Session - 07/13/16 0846    Visit Number 5   Number of Visits 16   Date for PT Re-Evaluation 08/22/16   PT Start Time 0846   PT Stop Time 0943   PT Time Calculation (min) 57 min   Activity Tolerance Patient tolerated treatment well      Past Medical History:  Diagnosis Date  . Hyperlipidemia   . Pneumohemothorax    15 - 20  YRS AGO   CT PLACED  . PONV (postoperative nausea and vomiting)     Past Surgical History:  Procedure Laterality Date  . ABDOMINAL HYSTERECTOMY     partial  . BREAST LUMPECTOMY     RT      1981   . CHEST TUBE INSERTION    . TRANSFORAMINAL LUMBAR INTERBODY FUSION (TLIF) WITH PEDICLE SCREW FIXATION 1 LEVEL N/A 01/26/2016   Procedure: L4-5 TRANSFORAMINAL LUMBAR INTERBODY FUSION (TLIF) WITH PEDICLE SCREW FIXATION ;  Surgeon: Eustace Moore, MD;  Location: [place] [facility];  Service: Neurosurgery;  Laterality: N/A;  L4-5 TRANSFORAMINAL LUMBAR INTERBODY FUSION (TLIF) WITH PEDICLE SCREW FIXATION     There were no vitals filed for this visit.      Subjective Assessment - 07/13/16 0846    Subjective Good day yesterday not yet moving and loosened up today. Does better when she has stretched out. Hips feel tight. Sore in the Rt hip.    Currently in Pain? Yes   Pain Score 3    Pain Location Hip   Pain Orientation Right   Pain Descriptors / Indicators Sore   Pain Type Chronic pain   Pain Onset More than a month ago   Pain Frequency Intermittent                         [facility] Adult PT Treatment/Exercise - 07/13/16 0001      Lumbar Exercises: Stretches   Passive Hamstring Stretch 3 reps;30 seconds   ITB  Stretch 3 reps;30 seconds  cross body with strap   Piriformis Stretch 1 rep;30 seconds  rt     Lumbar Exercises: Aerobic   Stationary Bike Nustep L5 x 7 min      Lumbar Exercises: Seated   Long Arc Quad on Chair Strengthening;Both;10 reps  3 sec hold sitting on blue disc     Lumbar Exercises: Supine   Ab Set 10 reps   Bridge 10 reps  with marching     Moist Heat Therapy   Number Minutes Moist Heat 20 Minutes   Moist Heat Location Lumbar Spine;Hip     Electrical Stimulation   Electrical Stimulation Location lumbar spine; Rt hip    Electrical Stimulation Action IFC   Electrical Stimulation Parameters to tolerance   Electrical Stimulation Goals Pain;Tone     Manual Therapy   Manual therapy comments pt prone    Soft tissue mobilization lumbar paraspinals; Rt hip   Myofascial Release lumbar to Rt hip           Trigger Point Dry Needling - 07/13/16 0921    Consent Given? Yes   Education Handout Provided Yes   Muscles Treated Lower Body --  lumbar paraspinals - decreased palpable tightness    Gluteus Maximus Response Palpable increased muscle length   Gluteus Minimus Response Palpable increased muscle length   Piriformis Response Palpable increased muscle length              PT Education - 07/13/16 0922    Education provided Yes   Education Details TDN   Person(s) Educated Patient   Methods Explanation          PT Short Term Goals - 07/11/16 1113      PT SHORT TERM GOAL #1   Title I with initial HEP ( 07/23/16   Status Achieved     PT SHORT TERM GOAL #2   Title demo Rt hip strength =/> 4+/5 throughout ( 07/23/16)    Status Partially Met     PT SHORT TERM GOAL #3   Title improve FOTO =/< 49% limited ( 07/23/16)    Status On-going           PT Long Term Goals - 07/11/16 1117      PT LONG TERM GOAL #1   Title I with advanced HEP ( 08/22/16)    Status On-going     PT LONG TERM GOAL #2   Title demo Rt knee and ankle strength =/> 5-/5 (  08/22/16)    Status On-going     PT LONG TERM GOAL #3   Title report overall pain reduction =/> 75% in Rt LE ( 08/22/16)    Status On-going     PT LONG TERM GOAL #4   Title ambulate on even /uneven surface without obvious gait deviations ( 08/22/16)    Status On-going     PT LONG TERM GOAL #5   Title improve FOTO =/< 39% limited ( 08/22/16)    Status On-going     Additional Long Term Goals   Additional Long Term Goals Yes     PT LONG TERM GOAL #6   Title improve Rt ankle dorsiflexion =/> 12 degrees and eversion =/> 20 degrees ( 08/22/16)    Time 6   Period Weeks   Status New     PT LONG TERM GOAL #7   Title report Rt foot pain decrease =/> 75% with walking ( 08/22/16)    Time 6   Period Weeks   Status New               Plan - 07/13/16 5638    Clinical Impression Statement Patient continues to have ataxic looking gait pattern with difficulty in placing feet  - especially Rt and moving Rt LE to take step. Poor placement and weight bearing. Tends to cross legs and move laterally with gait.    Rehab Potential Good   PT Frequency 2x / week   PT Duration 8 weeks   PT Treatment/Interventions Moist Heat;Ultrasound;Therapeutic exercise;Dry needling;Taping;Balance training;Neuromuscular re-education;Cryotherapy;Gait training;Electrical Stimulation;Patient/family education   PT Next Visit Plan assess response to TDN to Rt gluts/low back   Consulted and Agree with Plan of Care Patient      Patient will benefit from skilled therapeutic intervention in order to improve the following deficits and impairments:  Abnormal gait, Difficulty walking, Increased muscle spasms, Pain, Improper body mechanics, Decreased strength  Visit Diagnosis: Lumbago with sciatica, right side  Muscle weakness (generalized)  Other abnormalities of gait and mobility  Pain in right foot     Problem List Patient Active Problem List   Diagnosis Date Noted  . S/P lumbar spinal fusion 01/26/2016  [place], MPH 07/13/2016, 9:31 AM  [facility] [place], [place], [address] Phone: [phone]   Fax:  [phone]  Name: Catherine Grant MRN: [id number] Date of Birth: [birth year]

## 2016-07-16 ENCOUNTER — Ambulatory Visit (INDEPENDENT_AMBULATORY_CARE_PROVIDER_SITE_OTHER): Admitting: Rehabilitative and Restorative Service Providers"

## 2016-07-16 ENCOUNTER — Encounter: Payer: Self-pay | Admitting: Rehabilitative and Restorative Service Providers"

## 2016-07-16 DIAGNOSIS — M5441 Lumbago with sciatica, right side: Secondary | ICD-10-CM

## 2016-07-16 DIAGNOSIS — M6281 Muscle weakness (generalized): Secondary | ICD-10-CM | POA: Diagnosis not present

## 2016-07-16 DIAGNOSIS — R2689 Other abnormalities of gait and mobility: Secondary | ICD-10-CM

## 2016-07-16 DIAGNOSIS — M79671 Pain in right foot: Secondary | ICD-10-CM

## 2016-07-16 NOTE — Therapy (Signed)
[place], [place], [address] Phone: [phone]   Fax:  [phone]  Physical Therapy Treatment  Patient Details  Name: Catherine Grant MRN: [id number] Date of Birth: Oct 25, 1961 Referring Provider: Dr. Sherley Bounds   Encounter Date: 07/16/2016      PT End of Session - 07/16/16 0849    Visit Number 6   Number of Visits 16   Date for PT Re-Evaluation 08/22/16   PT Start Time 0845   PT Stop Time 0942   PT Time Calculation (min) 57 min   Activity Tolerance Patient tolerated treatment well      Past Medical History:  Diagnosis Date  . Hyperlipidemia   . Pneumohemothorax    15 - 20  YRS AGO   CT PLACED  . PONV (postoperative nausea and vomiting)     Past Surgical History:  Procedure Laterality Date  . ABDOMINAL HYSTERECTOMY     partial  . BREAST LUMPECTOMY     RT      1981   . CHEST TUBE INSERTION    . TRANSFORAMINAL LUMBAR INTERBODY FUSION (TLIF) WITH PEDICLE SCREW FIXATION 1 LEVEL N/A 01/26/2016   Procedure: L4-5 TRANSFORAMINAL LUMBAR INTERBODY FUSION (TLIF) WITH PEDICLE SCREW FIXATION ;  Surgeon: Eustace Moore, MD;  Location: [facility];  Service: Neurosurgery;  Laterality: N/A;  L4-5 TRANSFORAMINAL LUMBAR INTERBODY FUSION (TLIF) WITH PEDICLE SCREW FIXATION     There were no vitals filed for this visit.      Subjective Assessment - 07/16/16 0853    Subjective Toes are still curling. Patient has difficulty walking and wonders if shoes have anything to do with gait difficulties. Rt foot is feeling some better. Seems the Rt hip goes back too far sometimes. Does not want to use assistive device.    Currently in Pain? Yes   Pain Location Back   Pain Orientation Right   Pain Descriptors / Indicators Tightness  stiffness    Pain Type Chronic pain   Pain Radiating Towards into Rt hip    Pain Onset More than a month ago   Pain Frequency Intermittent            [facility] PT Assessment -  07/16/16 0001      Assessment   Medical Diagnosis lumbar radiculopathy   Referring Provider Dr. Sherley Bounds    Onset Date/Surgical Date 01/26/16   Hand Dominance Right   Next MD Visit 07/25/16     Strength   Right/Left Hip --  Lt WNL's   Right Hip Flexion 4/5   Right Hip Extension 4+/5   Right/Left Knee Right  Lt WNL's    Right Knee Flexion 4+/5   Right Knee Extension 4/5   Right/Left Ankle Right  Lt WNL's   Right Ankle Dorsiflexion 4+/5   Right Ankle Plantar Flexion 4/5   Right Ankle Inversion 4/5   Right Ankle Eversion 5/5   Lumbar Flexion --  poor transverse abdominals   Lumbar Extension --  multidifi     Flexibility   Hamstrings tight Rt compared to Lt    Quadriceps WNL   Piriformis tight bilat   Quadratus Lumborum tight Rt      Palpation   Palpation comment tight and tender in bilat gluts, Rt QL and piriformis      Ambulation/Gait   Ambulation/Gait Yes   Ambulation/Gait Assistance 7: Independent   Assistive device None  patient does not want to use assistive device  Gait Pattern Decreased stance time - right;Decreased step length - right;Decreased stride length;Right hip hike;Right steppage;Right genu recurvatum;Ataxic;Lateral hip instability                     [facility] Adult PT Treatment/Exercise - 07/16/16 0001      Lumbar Exercises: Stretches   Passive Hamstring Stretch 3 reps;30 seconds     Lumbar Exercises: Aerobic   Stationary Bike Nustep L5 x 7 min      Lumbar Exercises: Seated   Sit to Stand 15 reps     Lumbar Exercises: Supine   Ab Set 20 reps   Bridge 10 reps  w/ marching; ball squeeze; hip abd when up; Rt only x 20 ea     Moist Heat Therapy   Number Minutes Moist Heat 20 Minutes   Moist Heat Location Lumbar Spine;Hip     Electrical Stimulation   Electrical Stimulation Location lumbar spine; Rt hip    Electrical Stimulation Action IFC   Electrical Stimulation Parameters to tolerance   Electrical Stimulation Goals  Pain;Tone     Manual Therapy   Manual therapy comments pt prone    Soft tissue mobilization lumbar paraspinals; Rt hip   Myofascial Release lumbar to Rt hip           Trigger Point Dry Needling - 07/16/16 0944    Consent Given? Yes   Gluteus Maximus Response Twitch response elicited   Gluteus Minimus Response Twitch response elicited   Piriformis Response Palpable increased muscle length              PT Education - 07/16/16 0935    Education provided Yes   Education Details HEP   Person(s) Educated Patient   Methods Explanation;Demonstration;Tactile cues;Verbal cues;Handout   Comprehension Verbalized understanding;Returned demonstration;Verbal cues required;Tactile cues required          PT Short Term Goals - 07/11/16 1113      PT SHORT TERM GOAL #1   Title I with initial HEP ( 07/23/16   Status Achieved     PT SHORT TERM GOAL #2   Title demo Rt hip strength =/> 4+/5 throughout ( 07/23/16)    Status Partially Met     PT SHORT TERM GOAL #3   Title improve FOTO =/< 49% limited ( 07/23/16)    Status On-going           PT Long Term Goals - 07/16/16 0944      PT LONG TERM GOAL #1   Title I with advanced HEP ( 08/22/16)    Time 8   Period Weeks   Status On-going     PT LONG TERM GOAL #2   Title demo Rt knee and ankle strength =/> 5-/5 ( 08/22/16)    Time 8   Period Weeks   Status On-going     PT LONG TERM GOAL #3   Title report overall pain reduction =/> 75% in Rt LE ( 08/22/16)    Time 8   Period Weeks   Status On-going     PT LONG TERM GOAL #4   Title ambulate on even /uneven surface without obvious gait deviations ( 08/22/16)    Time 8   Period Weeks   Status On-going     PT LONG TERM GOAL #6   Title improve Rt ankle dorsiflexion =/> 12 degrees and eversion =/> 20 degrees ( 08/22/16)    Time 6   Period Weeks   Status On-going  PT LONG TERM GOAL #7   Title report Rt foot pain decrease =/> 75% with walking ( 08/22/16)    Time 6    Period Weeks   Status On-going               Plan - 07/16/16 0941    Clinical Impression Statement Continued ataxic gait pattern; LE weakness; poor coordincation and control of movement of Rt LE; muscular tightness through the Rt hip area.    Rehab Potential Good   PT Frequency 2x / week   PT Duration 8 weeks   PT Treatment/Interventions Moist Heat;Ultrasound;Therapeutic exercise;Dry needling;Taping;Balance training;Neuromuscular re-education;Cryotherapy;Gait training;Electrical Stimulation;Patient/family education   PT Next Visit Plan continue TDN to Rt gluts/low back/ piriformis note to MD at next visit    Consulted and Agree with Plan of Care Patient      Patient will benefit from skilled therapeutic intervention in order to improve the following deficits and impairments:  Abnormal gait, Difficulty walking, Increased muscle spasms, Pain, Improper body mechanics, Decreased strength  Visit Diagnosis: Lumbago with sciatica, right side  Muscle weakness (generalized)  Other abnormalities of gait and mobility  Pain in right foot     Problem List Patient Active Problem List   Diagnosis Date Noted  . S/P lumbar spinal fusion 01/26/2016    Takuya Lariccia Nilda Simmer PT, MPH  07/16/2016, 9:47 AM  [facility] [place], [place], [address] Phone: [phone]   Fax:  [phone]  Name: Catherine Grant MRN: [id number] Date of Birth: [birth year]

## 2016-07-16 NOTE — Patient Instructions (Signed)
Bridge up and hold  Bring knees apart together and then lower hips  Repeat 20 times   Bridge with squeezing ball between knees  20 times  Bridge  Using right leg only  Left leg stretched out straight(don't use it to push)  Standing one foot forward one back Shift weight to forward foot lifting back heel Shift weight to back foot lifting front toes 20 + reps several times a day  Sit to stand slowly 20 times

## 2016-07-19 ENCOUNTER — Ambulatory Visit (INDEPENDENT_AMBULATORY_CARE_PROVIDER_SITE_OTHER): Admitting: Physical Therapy

## 2016-07-19 ENCOUNTER — Encounter: Payer: Self-pay | Admitting: Physical Therapy

## 2016-07-19 DIAGNOSIS — M5441 Lumbago with sciatica, right side: Secondary | ICD-10-CM

## 2016-07-19 DIAGNOSIS — M6281 Muscle weakness (generalized): Secondary | ICD-10-CM | POA: Diagnosis not present

## 2016-07-19 DIAGNOSIS — R2689 Other abnormalities of gait and mobility: Secondary | ICD-10-CM

## 2016-07-19 DIAGNOSIS — M79671 Pain in right foot: Secondary | ICD-10-CM

## 2016-07-19 NOTE — Therapy (Signed)
[facility] [place] [address] [place] [place], [place], [address] Phone: [phone]   Fax:  [phone]  Physical Therapy Treatment  Patient Details  Name: Catherine Grant MRN: [id number] Date of Birth: 1961-05-15 Referring Provider: Dr Marikay Alaravid Jones  Encounter Date: 07/19/2016      PT End of Session - 07/19/16 1109    Visit Number 7   Number of Visits 16   Date for PT Re-Evaluation 08/22/16   PT Start Time 1109   PT Stop Time 1203   PT Time Calculation (min) 54 min      Past Medical History:  Diagnosis Date  . Hyperlipidemia   . Pneumohemothorax    15 - 20  YRS AGO   CT PLACED  . PONV (postoperative nausea and vomiting)     Past Surgical History:  Procedure Laterality Date  . ABDOMINAL HYSTERECTOMY     partial  . BREAST LUMPECTOMY     RT      1981   . CHEST TUBE INSERTION    . TRANSFORAMINAL LUMBAR INTERBODY FUSION (TLIF) WITH PEDICLE SCREW FIXATION 1 LEVEL N/A 01/26/2016   Procedure: L4-5 TRANSFORAMINAL LUMBAR INTERBODY FUSION (TLIF) WITH PEDICLE SCREW FIXATION ;  Surgeon: Tia Alertavid S Jones, MD;  Location: [facility];  Service: Neurosurgery;  Laterality: N/A;  L4-5 TRANSFORAMINAL LUMBAR INTERBODY FUSION (TLIF) WITH PEDICLE SCREW FIXATION     There were no vitals filed for this visit.      Subjective Assessment - 07/19/16 1109    Subjective Reports her Rt great toe and second toe are still curling under.  Pain is improving. Very difficult    Currently in Pain? Yes   Pain Score 1    Pain Location Back   Pain Orientation Right   Pain Descriptors / Indicators Aching   Pain Type Surgical pain   Pain Onset More than a month ago   Pain Frequency Intermittent   Aggravating Factors  movement   Pain Relieving Factors gentle activity            [facility] PT Assessment - 07/19/16 0001      Assessment   Medical Diagnosis lumbar radiculopathy   Referring Provider Dr Marikay Alaravid Jones   Onset Date/Surgical Date 01/26/16   Hand  Dominance Right   Next MD Visit 07/25/16     Balance Screen   Has the patient fallen in the past 6 months No   Has the patient had a decrease in activity level because of a fear of falling?  No   Is the patient reluctant to leave their home because of a fear of falling?  No     Observation/Other Assessments   Focus on Therapeutic Outcomes (FOTO)  53% limited      Coordination   Gross Motor Movements are Fluid and Coordinated No   Coordination and Movement Description ataxic, cogwheel movement with stance on Rt LE     Functional Tests   Functional tests Single leg stance     Single Leg Stance   Comments > 3sec on Rt LE due to tremor type movement     AROM   Right Ankle Dorsiflexion 13     Strength   Right/Left Hip Right   Right Hip Flexion --  5-/5   Right Hip Extension --  5-/5   Right Hip ABduction 4+/5   Right/Left Knee Right   Right Knee Flexion 5/5   Right Knee Extension 5/5   Right/Left Ankle Right   Right Ankle  Dorsiflexion 5/5   Right Ankle Inversion 4+/5   Right Ankle Eversion 5/5                     [facility] Adult PT Treatment/Exercise - 07/19/16 0001      Lumbar Exercises: Aerobic   Stationary Bike Nustep L6 x 6 min      Lumbar Exercises: Standing   Other Standing Lumbar Exercises SLS Rt with Bilat UE assist, toe taps Lt FWD/BWD/side  SLS with PRN HHA   Other Standing Lumbar Exercises stance Rt working on coordination/form      Lumbar Exercises: Seated   Long Arc Quad on Chair Strengthening;Both;10 reps  10 sec     Lumbar Exercises: Supine   Bridge 20 reps  single leg each side   Other Supine Lumbar Exercises TA contractions with leg circles CS/CCW     Modalities   Modalities Electrical Stimulation;Moist Heat     Moist Heat Therapy   Number Minutes Moist Heat 15 Minutes   Moist Heat Location Lumbar Spine     Electrical Stimulation   Electrical Stimulation Location lumbar spine; Rt hip    Electrical Stimulation Action IFC     Electrical Stimulation Parameters to tolerance   Electrical Stimulation Goals Pain                  PT Short Term Goals - 07/19/16 1142      PT SHORT TERM GOAL #1   Title I with initial HEP ( 07/23/16   Status Achieved     PT SHORT TERM GOAL #2   Title demo Rt hip strength =/> 4+/5 throughout ( 07/23/16)    Status Achieved     PT SHORT TERM GOAL #3   Title improve FOTO =/< 49% limited ( 07/23/16)    Status On-going           PT Long Term Goals - 07/19/16 1115      PT LONG TERM GOAL #1   Title I with advanced HEP ( 08/22/16)    Time 8   Period Weeks   Status On-going     PT LONG TERM GOAL #2   Title demo Rt knee and ankle strength =/> 5-/5 ( 08/22/16)    Time 8   Status Achieved     PT LONG TERM GOAL #3   Title report overall pain reduction =/> 75% in Rt LE ( 08/22/16)    Time 8   Period Weeks   Status On-going  65-70% improvement     PT LONG TERM GOAL #4   Title ambulate on even /uneven surface without obvious gait deviations ( 08/22/16)    Time 8   Period Weeks   Status On-going     PT LONG TERM GOAL #5   Title improve FOTO =/< 39% limited ( 08/22/16)    Time 8   Period Weeks   Status On-going  scored 53% limited, was 59% on initial visit     PT LONG TERM GOAL #6   Title improve Rt ankle dorsiflexion =/> 12 degrees and eversion =/> 20 degrees ( 08/22/16)    Status Achieved     PT LONG TERM GOAL #7   Title report Rt foot pain decrease =/> 75% with walking ( 08/22/16)    Time 8   Period Weeks   Status On-going  30-40% improvement since starting PT               Plan - 07/19/16  1112    Clinical Impression Statement Pt continues with ataxic gait pattern, presents like she is having a central nervous system issue as compared to typical back surgery symptoms.  Would really like to see results of other neurological tests for other diseases.    Rehab Potential Good   PT Frequency 2x / week   PT Duration 8 weeks   PT  Treatment/Interventions Moist Heat;Ultrasound;Therapeutic exercise;Dry needling;Taping;Balance training;Neuromuscular re-education;Cryotherapy;Gait training;Electrical Stimulation;Patient/family education   Consulted and Agree with Plan of Care Patient      Patient will benefit from skilled therapeutic intervention in order to improve the following deficits and impairments:  Abnormal gait, Difficulty walking, Increased muscle spasms, Pain, Improper body mechanics, Decreased strength  Visit Diagnosis: Lumbago with sciatica, right side  Muscle weakness (generalized)  Other abnormalities of gait and mobility  Pain in right foot     Problem List Patient Active Problem List   Diagnosis Date Noted  . S/P lumbar spinal fusion 01/26/2016    Roderic Scarce PT  07/19/2016, 12:57 PM  [facility] [address] [place], [place], [address] Phone: [phone]   Fax:  [phone]  Name: Hennessey Cantrell MRN: [id number] Date of Birth: 13-Mar-1961

## 2016-07-19 NOTE — Patient Instructions (Addendum)
Balance: Three-Way Leg Swing - Do next to a counter for safety.     [place] [phone] - KEEP BELLY TIGHT FOR ALL     Stand on left foot, hands on hips. Reach other foot forward __1__ times, sideways __1__ times, back _1___ times. Hold each position __1__ seconds. Relax. Repeat __10-30__ times per set. Do _1___ sets per session. Do _1-2___ sessions per day. Belly drawn in .  Balance: Unilateral  - do at a counter top for safety. Try to keep pelvis level     Attempt to balance on right leg, eyes open. Hold _as long as able, goal of 30 sec ___ seconds. Repeat _5-10___ times per set. Do __1__ sets per session. Do ___1-2_ sessions per day. Belly drawn in.   Forward, Backward stance.     On a level surface in a straight line, keep right foot facing forward. Step forward with left foot, come up on heel of right, step back with left foot, lifting right toes.  Keep belly tight.   Repeat 20-30 reps, 1-2 sets. Do _1___ sessions per day.   Copyright  VHI. All rights reserved.

## 2016-07-23 ENCOUNTER — Ambulatory Visit (INDEPENDENT_AMBULATORY_CARE_PROVIDER_SITE_OTHER): Admitting: Rehabilitative and Restorative Service Providers"

## 2016-07-23 ENCOUNTER — Encounter: Payer: Self-pay | Admitting: Rehabilitative and Restorative Service Providers"

## 2016-07-23 ENCOUNTER — Encounter: Admitting: Rehabilitative and Restorative Service Providers"

## 2016-07-23 DIAGNOSIS — M6281 Muscle weakness (generalized): Secondary | ICD-10-CM | POA: Diagnosis not present

## 2016-07-23 DIAGNOSIS — M79671 Pain in right foot: Secondary | ICD-10-CM | POA: Diagnosis not present

## 2016-07-23 DIAGNOSIS — R2689 Other abnormalities of gait and mobility: Secondary | ICD-10-CM | POA: Diagnosis not present

## 2016-07-23 DIAGNOSIS — M5441 Lumbago with sciatica, right side: Secondary | ICD-10-CM | POA: Diagnosis not present

## 2016-07-23 NOTE — Therapy (Signed)
[facility] [place] [address] [place] [place], [place], [address] Phone: [phone]   Fax:  [phone]  Physical Therapy Treatment  Patient Details  Name: Catherine Grant MRN: [id number] Date of Birth: Jan 29, 1961 Referring Provider: Dr Marikay Alar  Encounter Date: 07/23/2016      PT End of Session - 07/23/16 1359    Visit Number 8   Number of Visits 16   Date for PT Re-Evaluation 08/22/16   PT Start Time 1400   PT Stop Time 1447   PT Time Calculation (min) 47 min   Activity Tolerance Patient tolerated treatment well      Past Medical History:  Diagnosis Date  . Hyperlipidemia   . Pneumohemothorax    15 - 20  YRS AGO   CT PLACED  . PONV (postoperative nausea and vomiting)     Past Surgical History:  Procedure Laterality Date  . ABDOMINAL HYSTERECTOMY     partial  . BREAST LUMPECTOMY     RT      1981   . CHEST TUBE INSERTION    . TRANSFORAMINAL LUMBAR INTERBODY FUSION (TLIF) WITH PEDICLE SCREW FIXATION 1 LEVEL N/A 01/26/2016   Procedure: L4-5 TRANSFORAMINAL LUMBAR INTERBODY FUSION (TLIF) WITH PEDICLE SCREW FIXATION ;  Surgeon: Tia Alert, MD;  Location: [facility];  Service: Neurosurgery;  Laterality: N/A;  L4-5 TRANSFORAMINAL LUMBAR INTERBODY FUSION (TLIF) WITH PEDICLE SCREW FIXATION     There were no vitals filed for this visit.      Subjective Assessment - 07/23/16 1406    Subjective MD reports that she was seen by MD and he is going to send her to an orthopedic specialist and then a neuroloogist. MD feels the problems are related to the gait and her hips.    Currently in Pain? No/denies                         [facility] Adult PT Treatment/Exercise - 07/23/16 0001      Ambulation/Gait   Gait Comments working on gait with slowed gait pattern - 40 ft x 3      Lumbar Exercises: Standing   Other Standing Lumbar Exercises weight shift to each side with Bilat UE assist, toe taps fwd onto 8 inch  step alt Rt/Lt   SLS with PRN HHA   Other Standing Lumbar Exercises stance Rt working on coordination/form; shallow knee bends with focus on symmetry and equal use of bilat LE's(shifts weight to Lt); backwards walking alt with heel to toe walking with Lt UE support 6 feet x 10 trials     Lumbar Exercises: Seated   Sit to Stand 15 reps  difficulty keeping weight equal on both LE's    Sit to Stand Limitations repeated with Rt LE behind Lt LE for 10 reps      Lumbar Exercises: Supine   Bridge 20 reps  single leg each side engaging core                  PT Short Term Goals - 07/19/16 1142      PT SHORT TERM GOAL #1   Title I with initial HEP ( 07/23/16   Status Achieved     PT SHORT TERM GOAL #2   Title demo Rt hip strength =/> 4+/5 throughout ( 07/23/16)    Status Achieved     PT SHORT TERM GOAL #3   Title improve FOTO =/< 49% limited ( 07/23/16)  Status On-going           PT Long Term Goals - 07/19/16 1115      PT LONG TERM GOAL #1   Title I with advanced HEP ( 08/22/16)    Time 8   Period Weeks   Status On-going     PT LONG TERM GOAL #2   Title demo Rt knee and ankle strength =/> 5-/5 ( 08/22/16)    Time 8   Status Achieved     PT LONG TERM GOAL #3   Title report overall pain reduction =/> 75% in Rt LE ( 08/22/16)    Time 8   Period Weeks   Status On-going  65-70% improvement     PT LONG TERM GOAL #4   Title ambulate on even /uneven surface without obvious gait deviations ( 08/22/16)    Time 8   Period Weeks   Status On-going     PT LONG TERM GOAL #5   Title improve FOTO =/< 39% limited ( 08/22/16)    Time 8   Period Weeks   Status On-going  scored 53% limited, was 59% on initial visit     PT LONG TERM GOAL #6   Title improve Rt ankle dorsiflexion =/> 12 degrees and eversion =/> 20 degrees ( 08/22/16)    Status Achieved     PT LONG TERM GOAL #7   Title report Rt foot pain decrease =/> 75% with walking ( 08/22/16)    Time 8   Period  Weeks   Status On-going  30-40% improvement since starting PT               Plan - 07/23/16 1410    Clinical Impression Statement Ataxic gait; difficulty standing in static postures with noted fasciulations in Rt LE palpable and visual. She has difficulty with smooth transitions of weight bearing rt to Lt. Continues to demonstrated with neurological problems Rt LE. Compensates well with good balance and with moving quickly over the Rt LE.    Rehab Potential Good   PT Frequency 2x / week   PT Duration 8 weeks   PT Treatment/Interventions Moist Heat;Ultrasound;Therapeutic exercise;Dry needling;Taping;Balance training;Neuromuscular re-education;Cryotherapy;Gait training;Electrical Stimulation;Patient/family education   PT Next Visit Plan continue TDN to Rt gluts/low back/ piriformis as indicated; ther ex focus on coordination and weight bearing activities    Consulted and Agree with Plan of Care Patient      Patient will benefit from skilled therapeutic intervention in order to improve the following deficits and impairments:  Abnormal gait, Difficulty walking, Increased muscle spasms, Pain, Improper body mechanics, Decreased strength  Visit Diagnosis: Lumbago with sciatica, right side  Muscle weakness (generalized)  Other abnormalities of gait and mobility  Pain in right foot     Problem List Patient Active Problem List   Diagnosis Date Noted  . S/P lumbar spinal fusion 01/26/2016    Rumaysa Sabatino Rober MinionP Sharunda Salmon PT, MPH  07/23/2016, 3:49 PM  [facility] [address] [place], [place], [address] Phone: [phone]   Fax:  [phone]  Name: Catherine Grant MRN: [id number] Date of Birth: [birth year]

## 2016-07-25 ENCOUNTER — Ambulatory Visit (INDEPENDENT_AMBULATORY_CARE_PROVIDER_SITE_OTHER): Admitting: Physical Therapy

## 2016-07-25 DIAGNOSIS — M6281 Muscle weakness (generalized): Secondary | ICD-10-CM

## 2016-07-25 DIAGNOSIS — M5441 Lumbago with sciatica, right side: Secondary | ICD-10-CM

## 2016-07-25 DIAGNOSIS — M79671 Pain in right foot: Secondary | ICD-10-CM

## 2016-07-25 DIAGNOSIS — R2689 Other abnormalities of gait and mobility: Secondary | ICD-10-CM | POA: Diagnosis not present

## 2016-07-25 NOTE — Therapy (Signed)
[facility] [place] [address] [place], [place], [address] Phone: [phone]   Fax:  [phone]  Physical Therapy Treatment  Patient Details  Name: Catherine Grant MRN: [id number] Date of Birth: 1961/09/29 Referring Provider: Dr Catherine Grant  Encounter Date: 07/25/2016      PT End of Session - 07/25/16 0934    Visit Number 9   Number of Visits 16   Date for PT Re-Evaluation 08/22/16   PT Start Time 0934   PT Stop Time 1014   PT Time Calculation (min) 40 min   Activity Tolerance Patient limited by fatigue      Past Medical History:  Diagnosis Date  . Hyperlipidemia   . Pneumohemothorax    15 - 20  YRS AGO   CT PLACED  . PONV (postoperative nausea and vomiting)     Past Surgical History:  Procedure Laterality Date  . ABDOMINAL HYSTERECTOMY     partial  . BREAST LUMPECTOMY     RT      1981   . CHEST TUBE INSERTION    . TRANSFORAMINAL LUMBAR INTERBODY FUSION (TLIF) WITH PEDICLE SCREW FIXATION 1 LEVEL N/A 01/26/2016   Procedure: L4-5 TRANSFORAMINAL LUMBAR INTERBODY FUSION (TLIF) WITH PEDICLE SCREW FIXATION ;  Surgeon: Catherine Alert, MD;  Location: [facility];  Service: Neurosurgery;  Laterality: N/A;  L4-5 TRANSFORAMINAL LUMBAR INTERBODY FUSION (TLIF) WITH PEDICLE SCREW FIXATION     There were no vitals filed for this visit.      Subjective Assessment - 07/25/16 0936    Subjective Catherine Grant reports she is feeling good today. no pain. Will see the neurologist next Thursday.    Currently in Pain? No/denies            Catherine Grant PT Assessment - 07/25/16 0001      Assessment   Medical Diagnosis lumbar radiculopathy   Referring Provider Dr Catherine Grant   Onset Date/Surgical Date 01/26/16     AROM   Right Ankle Dorsiflexion 10  78 degrees great toe extension                     [facility] Adult PT Treatment/Exercise - 07/25/16 0001      Neuro Re-ed    Neuro Re-ed Details  pregait work to facilitate  Rt LE ,work on step and stance phase. Reports feelings of stiffness in the ball of her Rt foot like she can't push off it.      Lumbar Exercises: Stretches   Active Hamstring Stretch 2 reps;30 seconds  in standing  with foot on step    Passive Hamstring Stretch Limitations Rt gastroc stretch in standing x 30 sec     Lumbar Exercises: Aerobic   Elliptical L3 x 5'     Manual Therapy   Manual Therapy Joint mobilization   Joint Mobilization Rt ankle and toes, posterior talus, great toe extension - is good, metatarsal mobs      Ankle Exercises: Seated   Towel Crunch 5 reps     Ankle Exercises: Plyometrics   Plyometric Exercises on rebounder jumping and bouncing.                   PT Short Term Goals - 07/19/16 1142      PT SHORT TERM GOAL #1   Title I with initial HEP ( 07/23/16   Status Achieved     PT SHORT TERM GOAL #2   Title demo Rt hip strength =/> 4+/5  throughout ( 07/23/16)    Status Achieved     PT SHORT TERM GOAL #3   Title improve FOTO =/< 49% limited ( 07/23/16)    Status On-going           PT Long Term Goals - 07/25/16 0939      PT LONG TERM GOAL #1   Title I with advanced HEP ( 08/22/16)    Status On-going     PT LONG TERM GOAL #2   Title demo Rt knee and ankle strength =/> 5-/5 ( 08/22/16)    Status Achieved     PT LONG TERM GOAL #3   Title report overall pain reduction =/> 75% in Rt LE ( 08/22/16)    Status On-going     PT LONG TERM GOAL #4   Title ambulate on even /uneven surface without obvious gait deviations ( 08/22/16)    Status On-going     PT LONG TERM GOAL #5   Title improve FOTO =/< 39% limited ( 08/22/16)    Status On-going     PT LONG TERM GOAL #6   Title improve Rt ankle dorsiflexion =/> 12 degrees and eversion =/> 20 degrees ( 08/22/16)    Status Achieved     PT LONG TERM GOAL #7   Title report Rt foot pain decrease =/> 75% with walking ( 08/22/16)    Status On-going               Plan - 07/25/16 1012     Clinical Impression Statement Catherine Grant is tolerating weightbearing exercise, having some Rt forefoot pain and tightness into the posterior Rt LE.  Continues with very ataxic Rt LE motion with walking.     Rehab Potential Good   PT Frequency 2x / week   PT Duration 8 weeks   PT Treatment/Interventions Moist Heat;Ultrasound;Therapeutic exercise;Dry needling;Taping;Balance training;Neuromuscular re-education;Cryotherapy;Gait training;Electrical Stimulation;Patient/family education   PT Next Visit Plan ; ther ex focus on coordination and weight bearing activities       Patient will benefit from skilled therapeutic intervention in order to improve the following deficits and impairments:  Abnormal gait, Difficulty walking, Increased muscle spasms, Pain, Improper body mechanics, Decreased strength  Visit Diagnosis: Lumbago with sciatica, right side  Muscle weakness (generalized)  Other abnormalities of gait and mobility  Pain in right foot     Problem List Patient Active Problem List   Diagnosis Date Noted  . S/P lumbar spinal fusion 01/26/2016    Catherine ScarceSusan Grant PT 07/25/2016, 10:15 AM  [facility] [address] [place], [place], [address] Phone: [phone]   Fax:  [phone]  Name: Catherine Grant MRN: [id number] Date of Birth: Apr 08, 1961

## 2016-07-27 ENCOUNTER — Ambulatory Visit (INDEPENDENT_AMBULATORY_CARE_PROVIDER_SITE_OTHER): Admitting: Neurology

## 2016-07-27 ENCOUNTER — Encounter: Payer: Self-pay | Admitting: Neurology

## 2016-07-27 VITALS — BP 138/78 | HR 68 | Resp 14 | Ht 63.0 in | Wt 167.5 lb

## 2016-07-27 DIAGNOSIS — R269 Unspecified abnormalities of gait and mobility: Secondary | ICD-10-CM | POA: Diagnosis not present

## 2016-07-27 DIAGNOSIS — Z981 Arthrodesis status: Secondary | ICD-10-CM | POA: Diagnosis not present

## 2016-07-27 DIAGNOSIS — R292 Abnormal reflex: Secondary | ICD-10-CM | POA: Diagnosis not present

## 2016-07-27 DIAGNOSIS — M21371 Foot drop, right foot: Secondary | ICD-10-CM

## 2016-07-27 MED ORDER — LORAZEPAM 1 MG PO TABS: ORAL_TABLET | ORAL | 0 refills | Status: DC

## 2016-07-27 NOTE — Progress Notes (Signed)
[facility]  PATIENT: Catherine Grant DOB: 1961/01/02  REFERRING DOCTOR OR PCP:  Marikay Alar [facility]) SOURCE: patient, records from Dr. Yetta Barre and Dr. Murray Hodgkins, imaging reports, images on PACS. _________________________________   HISTORICAL  CHIEF COMPLAINT:  Chief Complaint  Patient presents with  . Extremity Weakness    Catherine Grant is here with her husband Caesar, for eval of right leg weakness/shakiness.  Sts. onset of sx., along with right sided lbp, about a yr. ago.  No relief with esi's, so in March, she had back surgery (L4-5 per pt).  Sts. following surgery, lower back pain is improved although still present, but feeling of right leg weakness and shakiness is worsening.  Sts. she had an EMG/NCV at [facility].  Also sts. she is participating in PT currently but doesn't feel it is helping.  She has also seen Dr. Aundria Rud at   . Back Pain    [facility], and had a right hip inj. yesterday./fim  . Right Hip Pain    HISTORY OF PRESENT ILLNESS:  I had the pleasure seeing you patient, Catherine Grant, at [facility] for neurologic consultation regarding her right leg weakness and gait difficulties.  Earlier this year, she began to experience back and right leg pain. MRI of the lumbar spine showed spondylolisthesis at L4-L5 associated with disc protrusion. She underwent fusion at L4-L5 by Dr. Yetta Barre in March 2017. She had noticed just a little bit of weakness in the leg but over the past 4-5 months she is noting more weakness in the leg. Specifically when she walks she will drag the foot some.    In retrospect, she noticed difficulties with her gait last year. She stopped jogging last year due to the difficulties. However, she felt that any changes were minimal until mid 2017.  She denies any numbness. She does have some pain in the leg as well as in the hip.   She denies any weakness numbness or clumsiness in the left leg. She denies  any weakness numbness or clumsiness in the arms.    She denies any difficulty with bladder function or bowel function. She has not had any changes in vision.    Three weeks ago, she had an epidural steroid injection and did not notice any change in her walking though pain was mildly better. Yesterday she had a trochanteric bursa injection in the right hip. Pain is mildly better but there is no improvement in her walking. She has done several weeks of physical therapy and has not noted any improvement.  On 03/22/2016, she had an EMG/NCV by Dr. Murray Hodgkins. It did not find any evidence of neuropathy or radiculopathy.  She has a cousin with MS.    REVIEW OF SYSTEMS: Constitutional: No fevers, chills, sweats, or change in appetite Eyes: No visual changes, double vision, eye pain Ear, nose and throat: No hearing loss, ear pain, nasal congestion, sore throat Cardiovascular: No chest pain, palpitations Respiratory: No shortness of breath at rest or with exertion.   No wheezes GastrointestinaI: No nausea, vomiting, diarrhea, abdominal pain, fecal incontinence Genitourinary: No dysuria, urinary retention or frequency.  No nocturia. Musculoskeletal: as above Integumentary: No rash, pruritus, skin lesions Neurological: as above Psychiatric: No depression at this time.  No anxiety Endocrine: No palpitations, diaphoresis, change in appetite, change in weigh or increased thirst Hematologic/Lymphatic: No anemia, purpura, petechiae. Allergic/Immunologic: No itchy/runny eyes, nasal congestion, recent allergic reactions, rashes  ALLERGIES: Allergies  Allergen Reactions  . Bupropion Rash  HOME MEDICATIONS:  Current Outpatient Prescriptions:  .  gabapentin (NEURONTIN) 300 MG capsule, Take 300-600 mg by mouth 2 (two) times daily. Take 1 capsule in the morning Take 2 capsules at night, Disp: , Rfl:  .  HYDROcodone-acetaminophen (NORCO/VICODIN) 5-325 MG tablet, Take 2 tablets by mouth every 4 (four)  hours as needed., Disp: 90 tablet, Rfl: 0 .  methocarbamol (ROBAXIN) 500 MG tablet, Take 1 tablet (500 mg total) by mouth every 6 (six) hours as needed for muscle spasms., Disp: 60 tablet, Rfl: 1 .  predniSONE (DELTASONE) 10 MG tablet, 6,5,4,3,2,1 taper, Disp: 21 tablet, Rfl: 0 .  Simvastatin (ZOCOR PO), Take by mouth., Disp: , Rfl:  .  simvastatin (ZOCOR) 80 MG tablet, Take 40 mg by mouth daily., Disp: , Rfl:   PAST MEDICAL HISTORY: Past Medical History:  Diagnosis Date  . Hyperlipidemia   . Pneumohemothorax    15 - 20  YRS AGO   CT PLACED  . PONV (postoperative nausea and vomiting)     PAST SURGICAL HISTORY: Past Surgical History:  Procedure Laterality Date  . ABDOMINAL HYSTERECTOMY     partial  . BREAST LUMPECTOMY     RT      1981   . CHEST TUBE INSERTION    . TRANSFORAMINAL LUMBAR INTERBODY FUSION (TLIF) WITH PEDICLE SCREW FIXATION 1 LEVEL N/A 01/26/2016   Procedure: L4-5 TRANSFORAMINAL LUMBAR INTERBODY FUSION (TLIF) WITH PEDICLE SCREW FIXATION ;  Surgeon: Tia Alertavid S Jones, MD;  Location: [facility];  Service: Neurosurgery;  Laterality: N/A;  L4-5 TRANSFORAMINAL LUMBAR INTERBODY FUSION (TLIF) WITH PEDICLE SCREW FIXATION     FAMILY HISTORY: Family History  Problem Relation Age of Onset  . Cancer Father     Lung CA  . Lung cancer Father   . Cancer Sister     Breast CA  . Glaucoma Mother     SOCIAL HISTORY:  Social History   Social History  . Marital status: Married    Spouse name: N/A  . Number of children: N/A  . Years of education: N/A   Occupational History  . Not on file.   Social History Main Topics  . Smoking status: Never Smoker  . Smokeless tobacco: Never Used  . Alcohol use No  . Drug use: No  . Sexual activity: Not on file   Other Topics Concern  . Not on file   Social History Narrative  . No narrative on file     PHYSICAL EXAM  Vitals:   07/27/16 1011  BP: 138/78  Pulse: 68  Resp: 14  Weight: 167 lb 8 oz (76 kg)  Height: 5\' 3"  (1.6  m)    Body mass index is 29.67 kg/m.   General: The patient is well-developed and well-nourished and in no acute distress  Eyes:  Funduscopic exam shows normal optic discs and retinal vessels.  Neck: The neck is supple with good ROM.  The neck is nontender.  Musculoskeletal:  Back is tender over the right piriformis muscle. The hip is tender over the trochanteric bursa on the right.  Neurologic Exam  Mental status: The patient is alert and oriented x 3 at the time of the examination. The patient has apparent normal recent and remote memory, with an apparently normal attention span and concentration ability.   Speech is normal.  Cranial nerves: Extraocular movements are full. Pupils are equal, round, and reactive to light and accomodation.  Visual fields are full.  Facial symmetry is present. There is good facial  sensation to soft touch bilaterally.Facial strength is normal.  Trapezius and sternocleidomastoid strength is normal. No dysarthria is noted.  The tongue is midline, and the patient has symmetric elevation of the soft palate. No obvious hearing deficits are noted.  Motor:  Muscle bulk is normal.   Tone appears normal. Strength is  5 / 5 in all 4 extremities.   Sensory: Sensory testing is intact to pinprick, soft touch and vibration sensation in all 4 extremities.  Coordination: Cerebellar testing reveals good finger-nose-finger and heel-to-shin bilaterally.  Gait and station: Station is normal.   Gait shows right foot drop and has a reduced stride. Tandem gait is poor. Romberg is negative.   Reflexes: Deep tendon reflexes are symmetric and normal bilaterally in arms and left leg but she has 2 beats of clonus with spread at the right knee. She had 2-3 beats of clonus at the right ankle..   Plantar responses are flexor.    DIAGNOSTIC DATA (LABS, IMAGING, TESTING) - I reviewed patient records, labs, notes, testing and imaging myself where available.  Lab Results  Component  Value Date   WBC 4.6 01/19/2016   HGB 12.6 01/19/2016   HCT 40.5 01/19/2016   MCV 95.5 01/19/2016   PLT 241 01/19/2016      Component Value Date/Time   NA 143 01/19/2016 1022   K 4.5 01/19/2016 1022   CL 111 01/19/2016 1022   CO2 23 01/19/2016 1022   GLUCOSE 78 01/19/2016 1022   BUN 14 01/19/2016 1022   CREATININE 1.12 (H) 01/19/2016 1022   CALCIUM 9.6 01/19/2016 1022   GFRNONAA 55 (L) 01/19/2016 1022   GFRAA >60 01/19/2016 1022       ASSESSMENT AND PLAN  Gait disturbance - Plan: MR Brain W Wo Contrast, MR Cervical Spine W Wo Contrast, MR Thoracic Spine W Wo Contrast  Right foot drop - Plan: MR Brain W Wo Contrast, MR Cervical Spine W Wo Contrast, MR Thoracic Spine W Wo Contrast  Hyperreflexia - Plan: MR Brain W Wo Contrast, MR Cervical Spine W Wo Contrast, MR Thoracic Spine W Wo Contrast  S/P lumbar spinal fusion   In summary, Catherine Grant is a 55 year old woman with progressive difficulties with her right leg that worsened more significantly several months ago. On exam, she appears to have a foot drop but strength is actually normal in the right leg. However, deep tendon reflexes were increased in the right leg. Because of that finding, I am most concerned about a central process affecting the spinal cord or brain. This could include a myelopathy, mass or multiple sclerosis (with a more primary progressive course).   Therefore, we will check MRI of the brain, cervical and thoracic spine to rule out these processes. Based on the findings, further evaluation, treatment or referral may be necessary.    He studies are normal she will be followed clinically.   I will hold off on starting baclofen or tizanidine as the muscle tone did not appear to be increased in the right leg. She is continuing physical therapy and I advised her to ask her therapist about piriformis stretch exercises may help her buttock discomfort.  She will return to see me in 6 weeks but call sooner if she notes  significant worsening of her symptoms during the interim period.   We will call her with the results of the studies and bring her back sooner if needed.  Thank you for asking me to see Catherine Grant for neurologic consultation. Please let  me know if I can be of further assistance with her or other patients in the future.   Latosha Gaylord A. Epimenio Foot, MD, PhD 07/27/2016, 10:39 AM Certified in Neurology, Clinical Neurophysiology, Sleep Medicine, Pain Medicine and Neuroimaging  [facility] [address], [address] [place], [place] [address] [phone]

## 2016-07-31 ENCOUNTER — Encounter: Payer: Self-pay | Admitting: Physical Therapy

## 2016-07-31 ENCOUNTER — Ambulatory Visit (INDEPENDENT_AMBULATORY_CARE_PROVIDER_SITE_OTHER): Admitting: Physical Therapy

## 2016-07-31 DIAGNOSIS — M79671 Pain in right foot: Secondary | ICD-10-CM | POA: Diagnosis not present

## 2016-07-31 DIAGNOSIS — R2689 Other abnormalities of gait and mobility: Secondary | ICD-10-CM

## 2016-07-31 DIAGNOSIS — M5441 Lumbago with sciatica, right side: Secondary | ICD-10-CM | POA: Diagnosis not present

## 2016-07-31 DIAGNOSIS — M6281 Muscle weakness (generalized): Secondary | ICD-10-CM

## 2016-07-31 DIAGNOSIS — G8929 Other chronic pain: Secondary | ICD-10-CM

## 2016-07-31 NOTE — Therapy (Signed)
[place], [place], [address] Phone: [phone]   Fax:  [phone]  Physical Therapy Treatment  Patient Details  Name: Catherine Grant MRN: [id number] Date of Birth: 1961-08-22 Referring Provider: Dr Sherley Bounds  Encounter Date: 07/31/2016      PT End of Session - 07/31/16 1018    Visit Number 10   Number of Visits 16   Date for PT Re-Evaluation 08/22/16   PT Start Time 3235   PT Stop Time 1059   PT Time Calculation (min) 41 min   Activity Tolerance Patient tolerated treatment well      Past Medical History:  Diagnosis Date  . Hyperlipidemia   . Pneumohemothorax    15 - 20  YRS AGO   CT PLACED  . PONV (postoperative nausea and vomiting)     Past Surgical History:  Procedure Laterality Date  . ABDOMINAL HYSTERECTOMY     partial  . BREAST LUMPECTOMY     RT      1981   . CHEST TUBE INSERTION    . TRANSFORAMINAL LUMBAR INTERBODY FUSION (TLIF) WITH PEDICLE SCREW FIXATION 1 LEVEL N/A 01/26/2016   Procedure: L4-5 TRANSFORAMINAL LUMBAR INTERBODY FUSION (TLIF) WITH PEDICLE SCREW FIXATION ;  Surgeon: Eustace Moore, MD;  Location: [facility];  Service: Neurosurgery;  Laterality: N/A;  L4-5 TRANSFORAMINAL LUMBAR INTERBODY FUSION (TLIF) WITH PEDICLE SCREW FIXATION     There were no vitals filed for this visit.      Subjective Assessment - 07/31/16 1019    Subjective Saw neurologist and orthopedic MD.  Ortho MD gave her a hip injection - has no pain there now.  Neurologist ordered brain, and whole spine MRI    Patient Stated Goals keep toes from curling and walk again normal.  Used to run, not sure if she will again   Currently in Pain? No/denies                         [facility] Adult PT Treatment/Exercise - 07/31/16 0001      Neuro Re-ed    Neuro Re-ed Details  on BOSU, head turns side/side, up/down, in half kneel Rt leg in front trunk diagonals reaching with Rt UE.     Lumbar Exercises: Stretches   ITB Stretch 2 reps;30 seconds  cross body with strap   Piriformis Stretch 2 reps;30 seconds  Rt      Lumbar Exercises: Aerobic   Elliptical L3 x 5'     Lumbar Exercises: Standing   Other Standing Lumbar Exercises high kneel on foam, trunk leans BWD      Lumbar Exercises: Sidelying   Hip Abduction 5 reps  4 sets, pulsing forward & backward                PT Education - 07/31/16 1042    Education provided Yes   Education Details piriformis stretches   Person(s) Educated Patient   Methods Explanation;Handout;Demonstration   Comprehension Returned demonstration;Verbalized understanding          PT Short Term Goals - 07/19/16 1142      PT SHORT TERM GOAL #1   Title I with initial HEP ( 07/23/16   Status Achieved     PT SHORT TERM GOAL #2   Title demo Rt hip strength =/> 4+/5 throughout ( 07/23/16)    Status Achieved     PT SHORT TERM GOAL #3   Title improve FOTO =/<  49% limited ( 07/23/16)    Status On-going           PT Long Term Goals - 07/31/16 1024      PT LONG TERM GOAL #1   Title I with advanced HEP ( 08/22/16)    Status On-going     PT LONG TERM GOAL #2   Title demo Rt knee and ankle strength =/> 5-/5 ( 08/22/16)    Status Achieved     PT LONG TERM GOAL #3   Title report overall pain reduction =/> 75% in Rt LE ( 08/22/16)    Status Achieved     PT LONG TERM GOAL #4   Title ambulate on even /uneven surface without obvious gait deviations ( 08/22/16)    Status On-going     PT LONG TERM GOAL #5   Title improve FOTO =/< 39% limited ( 08/22/16)    Status On-going     PT LONG TERM GOAL #6   Title improve Rt ankle dorsiflexion =/> 12 degrees and eversion =/> 20 degrees ( 08/22/16)    Status Achieved     PT LONG TERM GOAL #7   Title report Rt foot pain decrease =/> 75% with walking ( 08/22/16)    Status On-going  only has the pain with walking, not at rest or standing still               Plan -  07/31/16 1049    Clinical Impression Statement Oveda is going to have further testing as soon as her MRI's are approved.  She is very challenged with exericse in low lunge, Rt LE forward and moving out of base of support. No new goals met. Also had a lot of difficulty performing sidelying hip work.    Rehab Potential Good   PT Frequency 2x / week   PT Duration 8 weeks   PT Treatment/Interventions Moist Heat;Ultrasound;Therapeutic exercise;Dry needling;Taping;Balance training;Neuromuscular re-education;Cryotherapy;Gait training;Electrical Stimulation;Patient/family education   PT Next Visit Plan focus on s/l hip work and coordination   Consulted and Agree with Plan of Care Patient      Patient will benefit from skilled therapeutic intervention in order to improve the following deficits and impairments:  Abnormal gait, Difficulty walking, Increased muscle spasms, Pain, Improper body mechanics, Decreased strength  Visit Diagnosis: Chronic bilateral low back pain with right-sided sciatica  Muscle weakness (generalized)  Other abnormalities of gait and mobility  Pain in right foot     Problem List Patient Active Problem List   Diagnosis Date Noted  . Right foot drop 07/27/2016  . Gait disturbance 07/27/2016  . 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 S/P lumbar spinal fusion 01/26/2016    Jeral Pinch PT  07/31/2016, 10:57 AM  [facility] [place], [place], [address] Phone: [phone]   Fax:  [phone]  Name: Catherine Grant MRN: [id number] Date of Birth: January 02, 1961

## 2016-07-31 NOTE — Patient Instructions (Addendum)
Outer Hip Stretch: Reclined IT Band Stretch (Strap)    Strap around opposite foot, pull across only as far as possible with shoulders on mat, hips rotate. Hold for __30__ secs. Repeat _2___ times each leg.   Piriformis (Supine)    Cross legs, right on top. Gently pull other knee toward chest until stretch is felt in buttock/hip of top leg. Hold _30___ seconds. Repeat __2__ times per set. Do ___1_ sets per session. Do __1__ sessions per day. Repeat on the other leg  Copyright  VHI. All rights reserved.

## 2016-08-01 ENCOUNTER — Telehealth: Payer: Self-pay | Admitting: Neurology

## 2016-08-01 NOTE — Telephone Encounter (Signed)
Pt called to schedule MRI.  °

## 2016-08-03 ENCOUNTER — Encounter: Admitting: Rehabilitative and Restorative Service Providers"

## 2016-08-07 ENCOUNTER — Encounter: Payer: Self-pay | Admitting: Physical Therapy

## 2016-08-07 ENCOUNTER — Ambulatory Visit (INDEPENDENT_AMBULATORY_CARE_PROVIDER_SITE_OTHER): Admitting: Physical Therapy

## 2016-08-07 DIAGNOSIS — R2689 Other abnormalities of gait and mobility: Secondary | ICD-10-CM

## 2016-08-07 DIAGNOSIS — M6281 Muscle weakness (generalized): Secondary | ICD-10-CM | POA: Diagnosis not present

## 2016-08-07 DIAGNOSIS — M5441 Lumbago with sciatica, right side: Secondary | ICD-10-CM

## 2016-08-07 DIAGNOSIS — G8929 Other chronic pain: Secondary | ICD-10-CM | POA: Diagnosis not present

## 2016-08-07 NOTE — Therapy (Signed)
[facility] [place] [address] [place], [place], [address] Phone: [phone]   Fax:  [phone]  Physical Therapy Treatment  Patient Details  Name: Catherine Grant MRN: [id number] Date of Birth: 1961/01/29 Referring Provider: Dr Marikay Alar  Encounter Date: 08/07/2016      PT End of Session - 08/07/16 0938    Visit Number 11   Number of Visits 16   Date for PT Re-Evaluation 08/22/16   PT Start Time 0936   PT Stop Time 1016   PT Time Calculation (min) 40 min   Activity Tolerance Patient tolerated treatment well      Past Medical History:  Diagnosis Date  . Hyperlipidemia   . Pneumohemothorax    15 - 20  YRS AGO   CT PLACED  . PONV (postoperative nausea and vomiting)     Past Surgical History:  Procedure Laterality Date  . ABDOMINAL HYSTERECTOMY     partial  . BREAST LUMPECTOMY     RT      1981   . CHEST TUBE INSERTION    . TRANSFORAMINAL LUMBAR INTERBODY FUSION (TLIF) WITH PEDICLE SCREW FIXATION 1 LEVEL N/A 01/26/2016   Procedure: L4-5 TRANSFORAMINAL LUMBAR INTERBODY FUSION (TLIF) WITH PEDICLE SCREW FIXATION ;  Surgeon: Tia Alert, MD;  Location: [facility];  Service: Neurosurgery;  Laterality: N/A;  L4-5 TRANSFORAMINAL LUMBAR INTERBODY FUSION (TLIF) WITH PEDICLE SCREW FIXATION     There were no vitals filed for this visit.      Subjective Assessment - 08/07/16 0936    Subjective She reports she had a ton of trouble walking wed after Tuesdays treatment. She stretcheds and it helped some, did more walking yesterday. Still riding her stationary bike 12 miles   Patient Stated Goals keep toes from curling and walk again normal.  Used to run, not sure if she will again   Currently in Pain? No/denies            [facility] PT Assessment - 08/07/16 0001      Assessment   Medical Diagnosis lumbar radiculopathy                     [facility] Adult PT Treatment/Exercise - 08/07/16 0001      Lumbar  Exercises: Stretches   Passive Hamstring Stretch 1 rep;30 seconds  with strap   Passive Hamstring Stretch Limitations Rt gastroc stretch in standing x 30 sec   Single Knee to Chest Stretch 1 rep;20 seconds   Quad Stretch --  hip flexor stretch in low lunge x 30 sec each   ITB Stretch 1 rep;20 seconds  cross body with strap   Piriformis Stretch 1 rep;20 seconds     Lumbar Exercises: Aerobic   Elliptical L5 x5'     Lumbar Exercises: Standing   Other Standing Lumbar Exercises standing Rt/Lt  foot taps on tape in a square   Other Standing Lumbar Exercises 2x10 high kneel BWD trunk leans      Lumbar Exercises: Supine   Other Supine Lumbar Exercises 10 reps hip abduct, red band bilat and then single with TA contraction.     Lumbar Exercises: Prone   Other Prone Lumbar Exercises 4x5 lower body lifts, legs wide with heel taps     Lumbar Exercises: Quadruped   [place] Horse Limitations 10 reps, VC for form                  PT Short Term Goals - 08/07/16  [place] #1   Title I with initial HEP ( 07/23/16   Time 4   Status Achieved     PT SHORT TERM GOAL #2   Title demo Rt hip strength =/> 4+/5 throughout ( 07/23/16)    Status Achieved     PT SHORT TERM GOAL #3   Title improve FOTO =/< 49% limited ( 07/23/16)    Status On-going           PT Long Term Goals - 08/07/16 0939      PT LONG TERM GOAL #1   Title I with advanced HEP ( 08/22/16)    Status On-going     PT LONG TERM GOAL #2   Title demo Rt knee and ankle strength =/> 5-/5 ( 08/22/16)    Status Achieved     PT LONG TERM GOAL #3   Title report overall pain reduction =/> 75% in Rt LE ( 08/22/16)    Status Achieved     PT LONG TERM GOAL #4   Title ambulate on even /uneven surface without obvious gait deviations ( 08/22/16)    Status On-going     PT LONG TERM GOAL #5   Title improve FOTO =/< 39% limited ( 08/22/16)    Status On-going     PT LONG TERM GOAL #6   Title improve Rt  ankle dorsiflexion =/> 12 degrees and eversion =/> 20 degrees ( 08/22/16)    Status Achieved     PT LONG TERM GOAL #7   Title report Rt foot pain decrease =/> 75% with walking ( 08/22/16)    Status On-going  the first two toes are still curling under with walking               Plan - 08/07/16 1013    Clinical Impression Statement Catherine Grant struggles with higher level core/neuro exercises. they fatigue her out and cause increased tremors through out the Rt LE.  No new goals met. MRI is not scheduled yet, awaiting authorization.  She cancelled Fridays appointment due to schedule conflict.    Rehab Potential Good   PT Frequency 2x / week   PT Duration 8 weeks   PT Treatment/Interventions Moist Heat;Ultrasound;Therapeutic exercise;Dry needling;Taping;Balance training;Neuromuscular re-education;Cryotherapy;Gait training;Electrical Stimulation;Patient/family education   PT Next Visit Plan focus on s/l hip work and coordination   Consulted and Agree with Plan of Care Patient      Patient will benefit from skilled therapeutic intervention in order to improve the following deficits and impairments:  Abnormal gait, Difficulty walking, Increased muscle spasms, Pain, Improper body mechanics, Decreased strength  Visit Diagnosis: Chronic bilateral low back pain with right-sided sciatica  Muscle weakness (generalized)  Other abnormalities of gait and mobility     Problem List Patient Active Problem List   Diagnosis Date Noted  . Right foot drop 07/27/2016  . Gait disturbance 07/27/2016  . 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 S/P lumbar spinal fusion 01/26/2016    Jeral Pinch PT  08/07/2016, 10:16 AM  [facility] [place] [address] [place], [place], [address] Phone: [phone]   Fax:  [phone]  Name: Catherine Grant MRN: [id number] Date of Birth: 20-Mar-1961

## 2016-08-08 NOTE — Telephone Encounter (Signed)
Patient has [facility].

## 2016-08-10 ENCOUNTER — Encounter: Admitting: Rehabilitative and Restorative Service Providers"

## 2016-08-14 ENCOUNTER — Ambulatory Visit (INDEPENDENT_AMBULATORY_CARE_PROVIDER_SITE_OTHER): Admitting: Physical Therapy

## 2016-08-14 ENCOUNTER — Other Ambulatory Visit

## 2016-08-14 ENCOUNTER — Encounter: Payer: Self-pay | Admitting: Physical Therapy

## 2016-08-14 DIAGNOSIS — M5441 Lumbago with sciatica, right side: Secondary | ICD-10-CM | POA: Diagnosis not present

## 2016-08-14 DIAGNOSIS — M6281 Muscle weakness (generalized): Secondary | ICD-10-CM

## 2016-08-14 DIAGNOSIS — M79671 Pain in right foot: Secondary | ICD-10-CM | POA: Diagnosis not present

## 2016-08-14 DIAGNOSIS — G8929 Other chronic pain: Secondary | ICD-10-CM

## 2016-08-14 DIAGNOSIS — R2689 Other abnormalities of gait and mobility: Secondary | ICD-10-CM

## 2016-08-14 NOTE — Patient Instructions (Signed)
DEVELOPMENTAL POSITION: Half Kneeling With Weight Shift    Place one leg in front, maintain upright posture. Hold 5# wt, lift up over shoulder on leg that is in front, rotate and lower to the floor next to knee on the floor.  __10_ reps per set, _2-3__ sets per day, _7__ days per week Repeat with other leg.   Backward Lean (Kneeling)    Slowly lean back, keeping stomach tight, trunk rigid. Repeat __10__ times per set. Do _2-3__ sets per session. Do __1__ sessions per day. Copyright  V[employer]. All rights reserved.

## 2016-08-14 NOTE — Therapy (Signed)
[facility] [place] [address] [place], [place], [address] Phone: [phone]   Fax:  [phone]  Physical Therapy Treatment  Patient Details  Name: Catherine Grant MRN: [id number] Date of Birth: 10/22/1961 Referring Provider: Dr Marikay Alaravid Jones  Encounter Date: 08/14/2016      PT End of Session - 08/14/16 0930    Visit Number 12   Number of Visits 16   Date for PT Re-Evaluation 08/22/16   PT Start Time 0930   PT Stop Time 1013   PT Time Calculation (min) 43 min   Activity Tolerance Patient tolerated treatment well      Past Medical History:  Diagnosis Date  . Hyperlipidemia   . Pneumohemothorax    15 - 20  YRS AGO   CT PLACED  . PONV (postoperative nausea and vomiting)     Past Surgical History:  Procedure Laterality Date  . ABDOMINAL HYSTERECTOMY     partial  . BREAST LUMPECTOMY     RT      1981   . CHEST TUBE INSERTION    . TRANSFORAMINAL LUMBAR INTERBODY FUSION (TLIF) WITH PEDICLE SCREW FIXATION 1 LEVEL N/A 01/26/2016   Procedure: L4-5 TRANSFORAMINAL LUMBAR INTERBODY FUSION (TLIF) WITH PEDICLE SCREW FIXATION ;  Surgeon: Tia Alertavid S Jones, MD;  Location: [facility];  Service: Neurosurgery;  Laterality: N/A;  L4-5 TRANSFORAMINAL LUMBAR INTERBODY FUSION (TLIF) WITH PEDICLE SCREW FIXATION     There were no vitals filed for this visit.      Subjective Assessment - 08/14/16 0930    Subjective MRI is scheduled for this Friday at 630 AM, otherwise she is feeling good.  Had one really good day last week and did a lot of walking then she got really tight through both hips.    Patient Stated Goals keep toes from curling and walk again normal.  Used to run, not sure if she will again   Currently in Pain? No/denies  mainly only tightness.                          [facility] Adult PT Treatment/Exercise - 08/14/16 0001      Lumbar Exercises: Stretches   Passive Hamstring Stretch 2 reps;30 seconds  with strap    Passive Hamstring Stretch Limitations bilat gastroc on prostretch   Quad Stretch 2 reps;30 seconds  prone wiht strap   ITB Stretch 1 rep;30 seconds  cross body with strap     Lumbar Exercises: Aerobic   Elliptical L5 x6'     Lumbar Exercises: Standing   Heel Raises 10 reps  each off step, toes in/out/straight   Other Standing Lumbar Exercises Lt heel taps off 8" step 2x10, off 4" step  x15                PT Education - 08/14/16 0951    Education provided Yes   Education Details HEP   Person(s) Educated Patient   Methods Explanation;Handout   Comprehension Returned demonstration          PT Short Term Goals - 08/07/16 0939      PT SHORT TERM GOAL #1   Title I with initial HEP ( 07/23/16   Time 4   Status Achieved     PT SHORT TERM GOAL #2   Title demo Rt hip strength =/> 4+/5 throughout ( 07/23/16)    Status Achieved     PT SHORT TERM GOAL #3   Title  improve FOTO =/< 49% limited ( 07/23/16)    Status On-going           PT Long Term Goals - 08/14/16 0955      PT LONG TERM GOAL #1   Title I with advanced HEP ( 08/22/16)    Status On-going     PT LONG TERM GOAL #2   Title demo Rt knee and ankle strength =/> 5-/5 ( 08/22/16)    Status Achieved     PT LONG TERM GOAL #3   Title report overall pain reduction =/> 75% in Rt LE ( 08/22/16)    Status Achieved     PT LONG TERM GOAL #4   Title ambulate on even /uneven surface without obvious gait deviations ( 08/22/16)    Status On-going     PT LONG TERM GOAL #5   Title improve FOTO =/< 39% limited ( 08/22/16)    Status On-going     PT LONG TERM GOAL #6   Title improve Rt ankle dorsiflexion =/> 12 degrees and eversion =/> 20 degrees ( 08/22/16)    Status Achieved     PT LONG TERM GOAL #7   Title report Rt foot pain decrease =/> 75% with walking ( 08/22/16)    Status --  has pain with walking fast               Plan - 08/14/16 1002    Clinical Impression Statement Today is a good day  for [name], she appears to walk with less tremors and slightly more control.  She fatigues quickly with higher level core work in half knee postion. Progressing to goals.  She has her MRI's this week, will hopefully have some answers after this.    Rehab Potential Good   PT Frequency 2x / week   PT Duration 8 weeks   PT Treatment/Interventions Moist Heat;Ultrasound;Therapeutic exercise;Dry needling;Taping;Balance training;Neuromuscular re-education;Cryotherapy;Gait training;Electrical Stimulation;Patient/family education   PT Next Visit Plan assess FOTO and make determination on continuation of therapy based on MRI results.    Consulted and Agree with Plan of Care Patient      Patient will benefit from skilled therapeutic intervention in order to improve the following deficits and impairments:  Abnormal gait, Difficulty walking, Increased muscle spasms, Pain, Improper body mechanics, Decreased strength  Visit Diagnosis: Chronic bilateral low back pain with right-sided sciatica  Muscle weakness (generalized)  Other abnormalities of gait and mobility  Pain in right foot     Problem List Patient Active Problem List   Diagnosis Date Noted  . Right foot drop 07/27/2016  . Gait disturbance 07/27/2016  . 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 S/P lumbar spinal fusion 01/26/2016    Roderic Scarce PT  08/14/2016, 10:12 AM  [facility] [address] [place], [place], [address] Phone: [phone]   Fax:  [phone]  Name: Catherine Grant MRN: [id number] Date of Birth: 1961/06/07

## 2016-08-17 ENCOUNTER — Ambulatory Visit
Admission: RE | Admit: 2016-08-17 | Discharge: 2016-08-17 | Disposition: A | Discharge status: Home / Self Care | Source: Ambulatory Visit | Attending: Neurology | Admitting: Neurology

## 2016-08-17 DIAGNOSIS — R269 Unspecified abnormalities of gait and mobility: Secondary | ICD-10-CM

## 2016-08-17 DIAGNOSIS — R292 Abnormal reflex: Secondary | ICD-10-CM | POA: Diagnosis not present

## 2016-08-17 DIAGNOSIS — M21371 Foot drop, right foot: Secondary | ICD-10-CM

## 2016-08-17 MED ORDER — GADOBENATE DIMEGLUMINE 529 MG/ML IV SOLN
15.0000 mL | Freq: Once | INTRAVENOUS | Status: AC | PRN
  Administered 2016-08-17: 15 mL via INTRAVENOUS

## 2016-08-20 ENCOUNTER — Other Ambulatory Visit: Payer: Self-pay | Admitting: Neurology

## 2016-08-20 ENCOUNTER — Ambulatory Visit (INDEPENDENT_AMBULATORY_CARE_PROVIDER_SITE_OTHER): Admitting: Rehabilitative and Restorative Service Providers"

## 2016-08-20 ENCOUNTER — Encounter: Payer: Self-pay | Admitting: Rehabilitative and Restorative Service Providers"

## 2016-08-20 ENCOUNTER — Telehealth: Payer: Self-pay | Admitting: Neurology

## 2016-08-20 DIAGNOSIS — R2689 Other abnormalities of gait and mobility: Secondary | ICD-10-CM | POA: Diagnosis not present

## 2016-08-20 DIAGNOSIS — M5441 Lumbago with sciatica, right side: Secondary | ICD-10-CM | POA: Diagnosis not present

## 2016-08-20 DIAGNOSIS — G8929 Other chronic pain: Secondary | ICD-10-CM

## 2016-08-20 DIAGNOSIS — M79671 Pain in right foot: Secondary | ICD-10-CM | POA: Diagnosis not present

## 2016-08-20 DIAGNOSIS — M6281 Muscle weakness (generalized): Secondary | ICD-10-CM

## 2016-08-20 MED ORDER — BACLOFEN 10 MG PO TABS: 10.0000 mg | ORAL_TABLET | Freq: Three times a day (TID) | ORAL | 6 refills | Status: DC

## 2016-08-20 NOTE — Telephone Encounter (Signed)
I have spoken with Catherine Grant this afternoon.  She just had some basic questions regarding Baclofen that I was able to answer for her/fim

## 2016-08-20 NOTE — Therapy (Signed)
[facility] [address] [place], [place], [address] Phone: [phone]   Fax:  [phone]  Physical Therapy Treatment  Patient Details  Name: Catherine Grant MRN: [id number] Date of Birth: 1961/05/23 Referring Provider: Dr Marikay Alaravid Jones   Encounter Date: 08/20/2016      PT End of Session - 08/20/16 0927    Visit Number 13   Number of Visits 16   Date for PT Re-Evaluation 08/22/16   PT Start Time 0928   PT Stop Time 1015   PT Time Calculation (min) 47 min   Activity Tolerance Patient tolerated treatment well      Past Medical History:  Diagnosis Date  . Hyperlipidemia   . Pneumohemothorax    15 - 20  YRS AGO   CT PLACED  . PONV (postoperative nausea and vomiting)     Past Surgical History:  Procedure Laterality Date  . ABDOMINAL HYSTERECTOMY     partial  . BREAST LUMPECTOMY     RT      1981   . CHEST TUBE INSERTION    . TRANSFORAMINAL LUMBAR INTERBODY FUSION (TLIF) WITH PEDICLE SCREW FIXATION 1 LEVEL N/A 01/26/2016   Procedure: L4-5 TRANSFORAMINAL LUMBAR INTERBODY FUSION (TLIF) WITH PEDICLE SCREW FIXATION ;  Surgeon: Tia Alertavid S Jones, MD;  Location: [facility];  Service: Neurosurgery;  Laterality: N/A;  L4-5 TRANSFORAMINAL LUMBAR INTERBODY FUSION (TLIF) WITH PEDICLE SCREW FIXATION     There were no vitals filed for this visit.      Subjective Assessment - 08/20/16 0930    Subjective Had MRI but does not knoe the results. Sees the doctor next week, 09/07/16. No changes in tightness or foot pain with walking. Both calves were "so sore" after last PT   Currently in Pain? No/denies            [facility] PT Assessment - 08/20/16 0001      Assessment   Medical Diagnosis lumbar radiculopathy   Referring Provider Dr Marikay Alaravid Jones    Onset Date/Surgical Date 01/26/16   Next MD Visit 09/07/16     AR[facility]   Right Ankle Dorsiflexion 14                     [facility] Adult PT Treatment/Exercise - 08/20/16  0001      Lumbar Exercises: Stretches   Passive Hamstring Stretch 2 reps;30 seconds  with strap   Quad Stretch 2 reps;30 seconds  prone wiht strap   ITB Stretch 30 seconds;2 reps  cross body with strap   Piriformis Stretch 2 reps;30 seconds     Lumbar Exercises: Aerobic   Elliptical L5 x6'     Lumbar Exercises: Standing   Functional Squats --  side step; squat; straighten; repeat both sides 20 ft ea   Other Standing Lumbar Exercises Lt heel taps off 8" step 2x10 forward and 10 x 2 sets side - both with bilat UE support    Other Standing Lumbar Exercises squats 10 x 2 sets; 10 w/ 8# wt partially extended in front of body; standing alternating hip abduction 10 x each     Ankle Exercises: Stretches   Gastroc Stretch 2 reps;30 seconds  repeat w/ prostretch 30 sec x 2 each foot                  PT Short Term Goals - 08/07/16 [id number]      PT SHORT TERM GOAL #1   Title I with initial HEP (  07/23/16   Time 4   Status Achieved     PT SHORT TERM GOAL #2   Title demo Rt hip strength =/> 4+/5 throughout ( 07/23/16)    Status Achieved     PT SHORT TERM GOAL #3   Title improve FOTO =/< 49% limited ( 07/23/16)    Status On-going           PT Long Term Goals - 08/20/16 1610      PT LONG TERM GOAL #1   Title I with advanced HEP ( 08/22/16)    Time 8   Period Weeks   Status On-going     PT LONG TERM GOAL #4   Title ambulate on even /uneven surface without obvious gait deviations ( 08/22/16)    Time 8   Period Weeks   Status On-going     PT LONG TERM GOAL #5   Title improve FOTO =/< 39% limited ( 08/22/16)    Time 8   Period Weeks   Status On-going     PT LONG TERM GOAL #7   Title report Rt foot pain decrease =/> 75% with walking ( 08/22/16)    Time 8   Period Weeks   Status On-going               Plan - 08/20/16 9604    Clinical Impression Statement Continued tightness and abnormal gait pattern. Continue to note some improved control with gait and  fatigue with higher level activities. No results of MRI to date. Progressing with therapy.   Rehab Potential Good   PT Frequency 2x / week   PT Duration 8 weeks   PT Treatment/Interventions Moist Heat;Ultrasound;Therapeutic exercise;Dry needling;Taping;Balance training;Neuromuscular re-education;Cryotherapy;Gait training;Electrical Stimulation;Patient/family education   PT Next Visit Plan assess FOTO and make determination on continuation of therapy based on MRI results as available.    Consulted and Agree with Plan of Care Patient      Patient will benefit from skilled therapeutic intervention in order to improve the following deficits and impairments:  Abnormal gait, Difficulty walking, Increased muscle spasms, Pain, Improper body mechanics, Decreased strength  Visit Diagnosis: Chronic bilateral low back pain with right-sided sciatica  Muscle weakness (generalized)  Other abnormalities of gait and mobility  Pain in right foot     Problem List Patient Active Problem List   Diagnosis Date Noted  . Right foot drop 07/27/2016  . Gait disturbance 07/27/2016  . 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 S/P lumbar spinal fusion 01/26/2016    Shan Padgett Rober Minion PT, MPH  08/20/2016, 10:12 AM  [facility] [address] [place], [place], [address] Phone: [phone]   Fax:  [phone]  Name: Catherine Grant MRN: [id number] Date of Birth: [birth year]

## 2016-08-20 NOTE — Telephone Encounter (Signed)
Patient called back after speaking with Dr. Epimenio FootSater regarding MRI results, has additional questions, please call [phone].

## 2016-08-20 NOTE — Telephone Encounter (Signed)
I spoke to Catherine Grant about her MRI results.   The MRI of the brain showed one spot in the cerebellum that is potentially worrisome for demyelination and she had some other punctate foci in the hemispheres that have a more typical age appropriate chronic microvascular ischemic appearance.  The MRI of the cervical and thoracic spinal cord showed some degenerative changes but the spinal cord appeared normal.  Therefore, I don't have a great explanation for her foot drop.   I will have her try baclofen as she had hyperreflexia to see if that helps and she will continue physical therapy and doing some of the exercises.  I will want to rescan her brain in about 9 months to make sure that there is no subclinical progression.

## 2016-08-23 ENCOUNTER — Encounter: Payer: Self-pay | Admitting: Physical Therapy

## 2016-08-23 ENCOUNTER — Ambulatory Visit (INDEPENDENT_AMBULATORY_CARE_PROVIDER_SITE_OTHER): Admitting: Physical Therapy

## 2016-08-23 ENCOUNTER — Encounter (INDEPENDENT_AMBULATORY_CARE_PROVIDER_SITE_OTHER): Payer: Self-pay

## 2016-08-23 DIAGNOSIS — M79671 Pain in right foot: Secondary | ICD-10-CM

## 2016-08-23 DIAGNOSIS — M6281 Muscle weakness (generalized): Secondary | ICD-10-CM | POA: Diagnosis not present

## 2016-08-23 DIAGNOSIS — R2689 Other abnormalities of gait and mobility: Secondary | ICD-10-CM

## 2016-08-23 DIAGNOSIS — M5441 Lumbago with sciatica, right side: Secondary | ICD-10-CM | POA: Diagnosis not present

## 2016-08-23 DIAGNOSIS — G8929 Other chronic pain: Secondary | ICD-10-CM

## 2016-08-23 NOTE — Therapy (Signed)
[facility] [address] [place], [place], [address] Phone: [phone]   Fax:  [phone]  Physical Therapy Treatment  Patient Details  Name: Catherine FormDemitrius Zuelke MRN: [id number] Date of Birth: 01-12-1961 Referring Provider: Dr Marikay Alaravid Jones  Encounter Date: 08/23/2016      PT End of Session - 08/23/16 0936    Visit Number 14   Number of Visits 20   Date for PT Re-Evaluation 09/13/16   PT Start Time 0936   PT Stop Time 1029   PT Time Calculation (min) 53 min   Activity Tolerance Patient limited by pain;Patient limited by fatigue      Past Medical History:  Diagnosis Date  . Hyperlipidemia   . Pneumohemothorax    15 - 20  YRS AGO   CT PLACED  . PONV (postoperative nausea and vomiting)     Past Surgical History:  Procedure Laterality Date  . ABDOMINAL HYSTERECTOMY     partial  . BREAST LUMPECTOMY     RT      1981   . CHEST TUBE INSERTION    . TRANSFORAMINAL LUMBAR INTERBODY FUSION (TLIF) WITH PEDICLE SCREW FIXATION 1 LEVEL N/A 01/26/2016   Procedure: L4-5 TRANSFORAMINAL LUMBAR INTERBODY FUSION (TLIF) WITH PEDICLE SCREW FIXATION ;  Surgeon: Tia Alertavid S Jones, MD;  Location: [facility];  Service: Neurosurgery;  Laterality: N/A;  L4-5 TRANSFORAMINAL LUMBAR INTERBODY FUSION (TLIF) WITH PEDICLE SCREW FIXATION     There were no vitals filed for this visit.      Subjective Assessment - 08/23/16 0939    Subjective Catherine Grant is very sore today, she over did it over the last couple of days, walking and riding her bike and now she is very sore in her Rt buttocks    Patient Stated Goals keep toes from curling and walk again normal.  Used to run, not sure if she will again   Currently in Pain? Yes   Pain Score 4    Pain Location Buttocks   Pain Orientation Right  some in left   Pain Descriptors / Indicators Aching   Pain Type Surgical pain   Pain Onset More than a month ago   Pain Frequency Constant   Aggravating  Factors  all weight bearing   Pain Relieving Factors gentle activity            [facility] PT Assessment - 08/23/16 0001      Assessment   Medical Diagnosis lumbar radiculopathy   Referring Provider Dr Marikay Alaravid Jones   Onset Date/Surgical Date 01/26/16   Next MD Visit 09/07/16     AROM   Right Ankle Dorsiflexion 14     Strength   Right Hip Flexion 4+/5   Right Hip Extension 5/5   Right Hip ABduction 4+/5                     [facility] Adult PT Treatment/Exercise - 08/23/16 0001      Lumbar Exercises: Stretches   Passive Hamstring Stretch 2 reps;30 seconds   Single Knee to Chest Stretch 2 reps;30 seconds   Lower Trunk Rotation --  10 reps   Quad Stretch 2 reps;30 seconds   ITB Stretch 2 reps;30 seconds   Piriformis Stretch 2 reps;30 seconds     Lumbar Exercises: Aerobic   Stationary Bike Nustep L5 x 6 min      Modalities   Modalities Electrical Stimulation;Moist Heat     Moist Heat Therapy   Number Minutes  Moist Heat 15 Minutes   Moist Heat Location Lumbar Spine     Electrical Stimulation   Electrical Stimulation Location lumbar spine; Rt hip    Electrical Stimulation Action IFC    Electrical Stimulation Parameters to tolerance   Electrical Stimulation Goals Pain                  PT Short Term Goals - 08/07/16 0939      PT SHORT TERM GOAL #1   Title I with initial HEP ( 07/23/16   Time 4   Status Achieved     PT SHORT TERM GOAL #2   Title demo Rt hip strength =/> 4+/5 throughout ( 07/23/16)    Status Achieved     PT SHORT TERM GOAL #3   Title improve FOTO =/< 49% limited ( 07/23/16)    Status On-going           PT Long Term Goals - 08/23/16 1000      PT LONG TERM GOAL #1   Title I with advanced HEP ( 09/13/16)    Time 3   Period Weeks   Status On-going     PT LONG TERM GOAL #2   Title demo Rt knee and ankle strength =/> 5-/5 ( 08/22/16)    Status Achieved     PT LONG TERM GOAL #3   Title report overall pain reduction =/> 75%  in Rt LE ( 08/22/16)    Status Achieved     PT LONG TERM GOAL #4   Title ambulate on even /uneven surface without obvious gait deviations ( 09/13/16)    Time 3   Period Weeks   Status On-going     PT LONG TERM GOAL #5   Title improve FOTO =/< 39% limited ( 09/13/16)    Time 3   Period Weeks   Status On-going     PT LONG TERM GOAL #6   Title improve Rt ankle dorsiflexion =/> 12 degrees and eversion =/> 20 degrees ( 08/22/16)    Status Achieved     PT LONG TERM GOAL #7   Title report Rt foot pain decrease =/> 75% with walking ( 09/13/16)    Time 3   Period Weeks   Status On-going               Plan - 08/23/16 1005    Clinical Impression Statement Demetrius presents with increased gait abnormalities today, She reports she is very sore and tired today as she did a lot of activity yesterday. She looks more unstable however still does not wish to use an assistive device.     Rehab Potential Good   PT Frequency 2x / week   PT Duration 8 weeks   PT Treatment/Interventions Moist Heat;Ultrasound;Therapeutic exercise;Dry needling;Taping;Balance training;Neuromuscular re-education;Cryotherapy;Gait training;Electrical Stimulation;Patient/family education   PT Next Visit Plan renew until she follows up with her MD in 3 weeks      Patient will benefit from skilled therapeutic intervention in order to improve the following deficits and impairments:  Abnormal gait, Difficulty walking, Increased muscle spasms, Pain, Improper body mechanics, Decreased strength  Visit Diagnosis: Pain in right foot - Plan: PT plan of care cert/re-cert  Other abnormalities of gait and mobility - Plan: PT plan of care cert/re-cert  Muscle weakness (generalized) - Plan: PT plan of care cert/re-cert  Chronic bilateral low back pain with right-sided sciatica - Plan: PT plan of care cert/re-cert     Problem List Patient Active Problem List  Diagnosis Date Noted  . Right foot drop 07/27/2016  .  Gait disturbance 07/27/2016  . 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 S/P lumbar spinal fusion 01/26/2016    Roderic Scarce PT  08/23/2016, 10:34 AM  [facility] [address] [place], [place], [address] Phone: [phone]   Fax:  [phone]  Name: Kamdyn Covel MRN: [id number] Date of Birth: 1961-07-22

## 2016-08-28 ENCOUNTER — Ambulatory Visit (INDEPENDENT_AMBULATORY_CARE_PROVIDER_SITE_OTHER): Admitting: Physical Therapy

## 2016-08-28 ENCOUNTER — Encounter: Payer: Self-pay | Admitting: Physical Therapy

## 2016-08-28 DIAGNOSIS — M6281 Muscle weakness (generalized): Secondary | ICD-10-CM | POA: Diagnosis not present

## 2016-08-28 DIAGNOSIS — R2689 Other abnormalities of gait and mobility: Secondary | ICD-10-CM | POA: Diagnosis not present

## 2016-08-28 DIAGNOSIS — M5441 Lumbago with sciatica, right side: Secondary | ICD-10-CM | POA: Diagnosis not present

## 2016-08-28 DIAGNOSIS — G8929 Other chronic pain: Secondary | ICD-10-CM

## 2016-08-28 DIAGNOSIS — M79671 Pain in right foot: Secondary | ICD-10-CM | POA: Diagnosis not present

## 2016-08-28 NOTE — Therapy (Signed)
[facility] [address] [place], [place], [address] Phone: [phone]   Fax:  [phone]  Physical Therapy Treatment  Patient Details  Name: Catherine Grant Matera MRN: [id number] Date of Birth: Oct 11, 1961 Referring Provider: Dr Marikay Alaravid Jones  Encounter Date: 08/28/2016      PT End of Session - 08/28/16 1257    Visit Number 15   Number of Visits 20   Date for PT Re-Evaluation 09/13/16   PT Start Time 1156   PT Stop Time 1244   PT Time Calculation (min) 48 min   Activity Tolerance Patient tolerated treatment well   Behavior [facility] Therapy [facility] for tasks assessed/performed      Past Medical History:  Diagnosis Date  . Hyperlipidemia   . Pneumohemothorax    15 - 20  YRS AGO   CT PLACED  . PONV (postoperative nausea and vomiting)     Past Surgical History:  Procedure Laterality Date  . ABDOMINAL HYSTERECTOMY     partial  . BREAST LUMPECTOMY     RT      1981   . CHEST TUBE INSERTION    . TRANSFORAMINAL LUMBAR INTERBODY FUSION (TLIF) WITH PEDICLE SCREW FIXATION 1 LEVEL N/A 01/26/2016   Procedure: L4-5 TRANSFORAMINAL LUMBAR INTERBODY FUSION (TLIF) WITH PEDICLE SCREW FIXATION ;  Surgeon: Tia Alertavid S Jones, MD;  Location: [facility];  Service: Neurosurgery;  Laterality: N/A;  L4-5 TRANSFORAMINAL LUMBAR INTERBODY FUSION (TLIF) WITH PEDICLE SCREW FIXATION     There were no vitals filed for this visit.      Subjective Assessment - 08/28/16 1158    Subjective (P)  Doing okay, having a pinch in the back. Strength in the leg is getting better.    Currently in Pain? (P)  Yes   Pain Score (P)  3    Pain Location (P)  Leg   Pain Orientation (P)  Right   Pain Descriptors / Indicators (P)  Tightness   Aggravating Factors  (P)  walking                         [facility] Adult PT Treatment/Exercise - 08/28/16 0001      Ambulation/Gait   Ambulation/Gait Yes   Gait Comments Unsteady pattern without gross loss of  balance. Noted trendelenburg RT>Lt, uneven stride length and occasional decreased Rt dorsiflexion. Utilizing verbal and tactile cues to address gait deviations.      Balance Poses: Yoga   Warrior I 5 reps;30 seconds  neutral rear foot placement     Lumbar Exercises: Aerobic   Elliptical L5 x6'     Lumbar Exercises: Sidelying   Clam 10 reps;3 seconds   Clam Limitations bilateral 1X10     Knee/Hip Exercises: Standing   Hip Flexion Stengthening;Both;2 sets;10 reps   Hip Flexion Limitations focus on pelvis stability   Other Standing Knee Exercises dynamic controlled march-focus on core stability with movement                PT Education - 08/28/16 1256    Education provided Yes   Education Details HEP, core stabilization with activity   Person(s) Educated Patient   Methods Explanation;Demonstration;Tactile cues;Verbal cues;Handout   Comprehension Verbalized understanding;Returned demonstration          PT Short Term Goals - 08/07/16 0939      PT SHORT TERM GOAL #1   Title I with initial HEP ( 07/23/16   Time 4   Status Achieved  PT SHORT TERM GOAL #2   Title demo Rt hip strength =/> 4+/5 throughout ( 07/23/16)    Status Achieved     PT SHORT TERM GOAL #3   Title improve FOTO =/< 49% limited ( 07/23/16)    Status On-going           PT Long Term Goals - 08/28/16 1301      PT LONG TERM GOAL #1   Title I with advanced HEP ( 09/13/16)    Time 3   Period Weeks   Status On-going     PT LONG TERM GOAL #2   Title demo Rt knee and ankle strength =/> 5-/5 ( 08/22/16)    Time 8   Period Weeks   Status Achieved     PT LONG TERM GOAL #3   Title report overall pain reduction =/> 75% in Rt LE ( 08/22/16)    Status Achieved     PT LONG TERM GOAL #4   Title ambulate on even /uneven surface without obvious gait deviations ( 09/13/16)    Time 3   Period Weeks   Status On-going     PT LONG TERM GOAL #5   Title improve FOTO =/< 39% limited ( 09/13/16)     Time 3   Period Weeks   Status On-going     PT LONG TERM GOAL #6   Title improve Rt ankle dorsiflexion =/> 12 degrees and eversion =/> 20 degrees ( 08/22/16)    Status Achieved     PT LONG TERM GOAL #7   Title report Rt foot pain decrease =/> 75% with walking ( 09/13/16)    Time 3   Period Weeks   Status On-going               Plan - 08/28/16 1257    Clinical Impression Statement Pt demonstrating poor pelvic stability with dynamic activities including ambulation. Pt able to demonstrate improved control with increased core stabilization and static activities. Session attempting to focus on stability static with gradual progression to more dynamic movements.     Rehab Potential Good   PT Frequency 2x / week   PT Duration 8 weeks   PT Treatment/Interventions Moist Heat;Ultrasound;Therapeutic exercise;Dry needling;Taping;Balance training;Neuromuscular re-education;Cryotherapy;Gait training;Electrical Stimulation;Patient/family education   PT Next Visit Plan working on static control and progressing to dynamic activity as able to control movement patterns   PT Home Exercise Plan review as needed   Consulted and Agree with Plan of Care Patient      Patient will benefit from skilled therapeutic intervention in order to improve the following deficits and impairments:  Abnormal gait, Difficulty walking, Increased muscle spasms, Pain, Improper body mechanics, Decreased strength  Visit Diagnosis: Pain in right foot  Other abnormalities of gait and mobility  Muscle weakness (generalized)  Chronic bilateral low back pain with right-sided sciatica     Problem List Patient Active Problem List   Diagnosis Date Noted  . Right foot drop 07/27/2016  . Gait disturbance 07/27/2016  . 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 S/P lumbar spinal fusion 01/26/2016    Delton SeeBenjamin Casha Estupinan, PT, CSCS 08/28/2016, 1:05 PM  [facility] [address] [place], [place], [address] Phone: [phone]   Fax:  [phone]  Name: Catherine Grant Dubree MRN: [id number] Date of Birth: [birth year]

## 2016-09-03 ENCOUNTER — Encounter: Payer: Self-pay | Admitting: Physical Therapy

## 2016-09-03 ENCOUNTER — Ambulatory Visit (INDEPENDENT_AMBULATORY_CARE_PROVIDER_SITE_OTHER): Admitting: Physical Therapy

## 2016-09-03 DIAGNOSIS — G8929 Other chronic pain: Secondary | ICD-10-CM

## 2016-09-03 DIAGNOSIS — M5441 Lumbago with sciatica, right side: Secondary | ICD-10-CM

## 2016-09-03 DIAGNOSIS — M79671 Pain in right foot: Secondary | ICD-10-CM

## 2016-09-03 DIAGNOSIS — R2689 Other abnormalities of gait and mobility: Secondary | ICD-10-CM

## 2016-09-03 DIAGNOSIS — M6281 Muscle weakness (generalized): Secondary | ICD-10-CM | POA: Diagnosis not present

## 2016-09-03 NOTE — Therapy (Signed)
[facility] [address] [place], [place], [address] Phone: [phone]   Fax:  [phone]  Physical Therapy Treatment  Patient Details  Name: Catherine FormDemitrius Stanaland MRN: [id number] Date of Birth: 1960/12/12 Referring Provider: Dr Marikay Alaravid Jones  Encounter Date: 09/03/2016      PT End of Session - 09/03/16 0926    Visit Number 16   Number of Visits 20   Date for PT Re-Evaluation 09/13/16   PT Start Time 0926   PT Stop Time 1012   PT Time Calculation (min) 46 min   Activity Tolerance Patient tolerated treatment well   Behavior [facility] tasks assessed/performed      Past Medical History:  Diagnosis Date  . Hyperlipidemia   . Pneumohemothorax    15 - 20  YRS AGO   CT PLACED  . PONV (postoperative nausea and vomiting)     Past Surgical History:  Procedure Laterality Date  . ABDOMINAL HYSTERECTOMY     partial  . BREAST LUMPECTOMY     RT      1981   . CHEST TUBE INSERTION    . TRANSFORAMINAL LUMBAR INTERBODY FUSION (TLIF) WITH PEDICLE SCREW FIXATION 1 LEVEL N/A 01/26/2016   Procedure: L4-5 TRANSFORAMINAL LUMBAR INTERBODY FUSION (TLIF) WITH PEDICLE SCREW FIXATION ;  Surgeon: Tia Alertavid S Jones, MD;  Location: [facility];  Service: Neurosurgery;  Laterality: N/A;  L4-5 TRANSFORAMINAL LUMBAR INTERBODY FUSION (TLIF) WITH PEDICLE SCREW FIXATION     There were no vitals filed for this visit.      Subjective Assessment - 09/03/16 0930    Subjective Not having any pain but more just tight through the Rt LE. Able to do a little more walking but got really tired after.    Currently in Pain? No/denies   Pain Location Buttocks   Pain Orientation Right   Pain Descriptors / Indicators Tightness   Aggravating Factors  walking   Pain Relieving Factors rest, heat                         [facility] Adult PT Treatment/Exercise - 09/03/16 0001      Ambulation/Gait   Gait Comments Unsteady pattern without  gross loss of balance. Noted trendelenburg RT>Lt, uneven stride length and occasional decreased Rt dorsiflexion. Utilizing verbal and tactile cues to address gait deviations. Utilizing ambulation for active rest throughout session.      Balance Poses: Yoga   Warrior I 5 reps;30 seconds  neutral rear foot placement   Warrior II 2 reps;30 seconds     Lumbar Exercises: Stretches   Active Hamstring Stretch 2 reps;30 seconds   Passive Hamstring Stretch 2 reps;30 seconds   ITB Stretch 2 reps;30 seconds  bent knee crossover     Lumbar Exercises: Aerobic   Elliptical L5 x6'     Lumbar Exercises: Standing   Other Standing Lumbar Exercises SLS bilateral X 10 seconds - focus on level pelvis     Lumbar Exercises: Seated   Other Seated Lumbar Exercises ball squeeze/hip abduction (red band) each 2X10.     Ankle Exercises: Stretches   Gastroc Stretch 2 reps;60 seconds                  PT Short Term Goals - 08/07/16 0939      PT SHORT TERM GOAL #1   Title I with initial HEP ( 07/23/16   Time 4   Status Achieved  PT SHORT TERM GOAL #2   Title demo Rt hip strength =/> 4+/5 throughout ( 07/23/16)    Status Achieved     PT SHORT TERM GOAL #3   Title improve FOTO =/< 49% limited ( 07/23/16)    Status On-going           PT Long Term Goals - 09/03/16 0933      PT LONG TERM GOAL #1   Title I with advanced HEP ( 09/13/16)    Time 3   Period Weeks   Status On-going     PT LONG TERM GOAL #2   Title demo Rt knee and ankle strength =/> 5-/5 ( 08/22/16)    Time 8   Period Weeks   Status Achieved     PT LONG TERM GOAL #3   Title report overall pain reduction =/> 75% in Rt LE ( 08/22/16)    Period Weeks   Status Achieved     PT LONG TERM GOAL #4   Title ambulate on even /uneven surface without obvious gait deviations ( 09/13/16)    Time 3   Status On-going     PT LONG TERM GOAL #5   Title improve FOTO =/< 39% limited ( 09/13/16)    Time 3   Period Weeks   Status  On-going     PT LONG TERM GOAL #6   Title improve Rt ankle dorsiflexion =/> 12 degrees and eversion =/> 20 degrees ( 08/22/16)    Status Achieved     PT LONG TERM GOAL #7   Title report Rt foot pain decrease =/> 75% with walking ( 09/13/16)    Time 3   Period Weeks   Status On-going               Plan - 09/03/16 1130    Clinical Impression Statement Working on combination of strength/endurance as well as balance activities with progression into functional activitiy such as ambulation. Pt continuing to have difficulty with Rt hip control during ambulation and gross instability through the Rt LE which increased with fatigue. Pt fatigued by end of session but she denies any complaints. To get results of MRI on 09/07/16.    Rehab Potential Good   PT Frequency 2x / week   PT Duration 8 weeks   PT Treatment/Interventions Moist Heat;Ultrasound;Therapeutic exercise;Dry needling;Taping;Balance training;Neuromuscular re-education;Cryotherapy;Gait training;Electrical Stimulation;Patient/family education   PT Next Visit Plan continue combination of strength/encurance and balance activities for improved gait stability. Work on standing exercises including SLS and pelvic control.    Consulted and Agree with Plan of Care Patient      Patient will benefit from skilled therapeutic intervention in order to improve the following deficits and impairments:     Visit Diagnosis: Pain in right foot  Other abnormalities of gait and mobility  Muscle weakness (generalized)  Chronic bilateral low back pain with right-sided sciatica     Problem List Patient Active Problem List   Diagnosis Date Noted  . Right foot drop 07/27/2016  . Gait disturbance 07/27/2016  . 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 S/P lumbar spinal fusion 01/26/2016    Delton SeeBenjamin Tennyson Wacha, PT, CSCS 09/03/2016, 11:36 AM  [facility] [address] [place], [place],  [address] Phone: [phone]   Fax:  [phone]  Name: Catherine FormDemitrius Grant MRN: [id number] Date of Birth: May 15, 1961

## 2016-09-06 ENCOUNTER — Encounter: Payer: Self-pay | Admitting: Physical Therapy

## 2016-09-06 ENCOUNTER — Ambulatory Visit (INDEPENDENT_AMBULATORY_CARE_PROVIDER_SITE_OTHER): Admitting: Physical Therapy

## 2016-09-06 DIAGNOSIS — R2689 Other abnormalities of gait and mobility: Secondary | ICD-10-CM | POA: Diagnosis not present

## 2016-09-06 DIAGNOSIS — M6281 Muscle weakness (generalized): Secondary | ICD-10-CM | POA: Diagnosis not present

## 2016-09-06 DIAGNOSIS — M5441 Lumbago with sciatica, right side: Secondary | ICD-10-CM

## 2016-09-06 DIAGNOSIS — M79671 Pain in right foot: Secondary | ICD-10-CM | POA: Diagnosis not present

## 2016-09-06 DIAGNOSIS — G8929 Other chronic pain: Secondary | ICD-10-CM

## 2016-09-06 NOTE — Therapy (Signed)
[facility] [address] [place], [place], [address] Phone: [phone]   Fax:  [phone]  Physical Therapy Treatment  Patient Details  Name: Catherine Grant MRN: [id number] Date of Birth: February 15, 1961 Referring Provider: Dr Marikay Alaravid Jones  Encounter Date: 09/06/2016      PT End of Session - 09/06/16 1656    Visit Number 17   Number of Visits 20   Date for PT Re-Evaluation 09/13/16   PT Start Time 0932   PT Stop Time 1017   PT Time Calculation (min) 45 min   Activity Tolerance Patient tolerated treatment well;Patient limited by fatigue   Behavior [facility] for tasks assessed/performed      Past Medical History:  Diagnosis Date  . Hyperlipidemia   . Pneumohemothorax    15 - 20  YRS AGO   CT PLACED  . PONV (postoperative nausea and vomiting)     Past Surgical History:  Procedure Laterality Date  . ABDOMINAL HYSTERECTOMY     partial  . BREAST LUMPECTOMY     RT      1981   . CHEST TUBE INSERTION    . TRANSFORAMINAL LUMBAR INTERBODY FUSION (TLIF) WITH PEDICLE SCREW FIXATION 1 LEVEL N/A 01/26/2016   Procedure: L4-5 TRANSFORAMINAL LUMBAR INTERBODY FUSION (TLIF) WITH PEDICLE SCREW FIXATION ;  Surgeon: Tia Alertavid S Jones, MD;  Location: [facility];  Service: Neurosurgery;  Laterality: N/A;  L4-5 TRANSFORAMINAL LUMBAR INTERBODY FUSION (TLIF) WITH PEDICLE SCREW FIXATION     There were no vitals filed for this visit.      Subjective Assessment - 09/06/16 0935    Subjective Still no pain, just feeling tight through both legs, especially the right. Still just can't walk right. To get results of neck/head/chest scans tomorrow.   Currently in Pain? No/denies                         [facility] Adult PT Treatment/Exercise - 09/06/16 0001      Ambulation/Gait   Gait Comments poor dorsiflexion with swing phase on Rt, attempted Green band dorsiflexion assist with improved pattern and stability.      Balance Poses: Yoga   Warrior I 30 seconds;2 reps  bilateral   Warrior II 3 reps;30 seconds  bilateral     Lumbar Exercises: Stretches   Passive Hamstring Stretch 2 reps;30 seconds   ITB Stretch 30 seconds;3 reps  bent knee crossover     Lumbar Exercises: Aerobic   Elliptical L5 x6'     Lumbar Exercises: Sidelying   Clam 10 reps;5 seconds   Clam Limitations red loop - bilateral 2X10   Hip Abduction 10 reps;3 seconds     Knee/Hip Exercises: Standing   Functional Squat 10 reps;2 sets   Functional Squat Limitations Lt foot forward     Ankle Exercises: Supine   Other Supine Ankle Exercises resisted ankle eversion 1X10     Ankle Exercises: Stretches   Gastroc Stretch 2 reps;60 seconds                PT Education - 09/06/16 1656    Education provided Yes   Education Details need for dorsiflexion with ambulation   Person(s) Educated Patient   Methods Explanation   Comprehension Verbalized understanding          PT Short Term Goals - 08/07/16 0939      PT SHORT TERM GOAL #1   Title I with initial HEP ( 07/23/16  Time 4   Status Achieved     PT SHORT TERM GOAL #2   Title demo Rt hip strength =/> 4+/5 throughout ( 07/23/16)    Status Achieved     PT SHORT TERM GOAL #3   Title improve FOTO =/< 49% limited ( 07/23/16)    Status On-going           PT Long Term Goals - 09/03/16 0933      PT LONG TERM GOAL #1   Title I with advanced HEP ( 09/13/16)    Time 3   Period Weeks   Status On-going     PT LONG TERM GOAL #2   Title demo Rt knee and ankle strength =/> 5-/5 ( 08/22/16)    Time 8   Period Weeks   Status Achieved     PT LONG TERM GOAL #3   Title report overall pain reduction =/> 75% in Rt LE ( 08/22/16)    Period Weeks   Status Achieved     PT LONG TERM GOAL #4   Title ambulate on even /uneven surface without obvious gait deviations ( 09/13/16)    Time 3   Status On-going     PT LONG TERM GOAL #5   Title improve FOTO =/< 39% limited (  09/13/16)    Time 3   Period Weeks   Status On-going     PT LONG TERM GOAL #6   Title improve Rt ankle dorsiflexion =/> 12 degrees and eversion =/> 20 degrees ( 08/22/16)    Status Achieved     PT LONG TERM GOAL #7   Title report Rt foot pain decrease =/> 75% with walking ( 09/13/16)    Time 3   Period Weeks   Status On-going               Plan - 09/06/16 1657    Clinical Impression Statement Pt with continued difficulty with ambulation with poor Rt LE coordination, strength and endurance. Improved gait with dorsiflexion assist (pt reports having AFO at home which she will bring in at her next session). The pt is gradually tolerateing more activity but continues to have difficulty with ambulation. Pt to get results of her MRI on 09/07/16.    Rehab Potential Good   PT Frequency 2x / week   PT Duration 8 weeks   PT Treatment/Interventions Moist Heat;Ultrasound;Therapeutic exercise;Dry needling;Taping;Balance training;Neuromuscular re-education;Cryotherapy;Gait training;Electrical Stimulation;Patient/family education   PT Next Visit Plan try gait with AFO or other dorsiflexion assist. Continue to work on strength and stability.    Consulted and Agree with Plan of Care Patient      Patient will benefit from skilled therapeutic intervention in order to improve the following deficits and impairments:  Abnormal gait, Difficulty walking, Increased muscle spasms, Pain, Improper body mechanics, Decreased strength  Visit Diagnosis: Pain in right foot  Other abnormalities of gait and mobility  Muscle weakness (generalized)  Chronic bilateral low back pain with right-sided sciatica     Problem List Patient Active Problem List   Diagnosis Date Noted  . Right foot drop 07/27/2016  . Gait disturbance 07/27/2016  . 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 S/P lumbar spinal fusion 01/26/2016    Delton SeeBenjamin Talise Sligh, PT, CSCS 09/06/2016, 6:07 PM  [facility] [address] [place], [place], [address] Phone: [phone]   Fax:  [phone]  Name: Catherine Grant MRN: [id number] Date of Birth: 1961/08/27

## 2016-09-07 ENCOUNTER — Other Ambulatory Visit: Payer: Self-pay | Admitting: *Deleted

## 2016-09-07 ENCOUNTER — Ambulatory Visit (INDEPENDENT_AMBULATORY_CARE_PROVIDER_SITE_OTHER): Admitting: Neurology

## 2016-09-07 ENCOUNTER — Encounter: Payer: Self-pay | Admitting: Neurology

## 2016-09-07 VITALS — BP 116/74 | HR 64 | Resp 14 | Ht 63.0 in | Wt 169.0 lb

## 2016-09-07 DIAGNOSIS — G959 Disease of spinal cord, unspecified: Secondary | ICD-10-CM | POA: Diagnosis not present

## 2016-09-07 DIAGNOSIS — R29898 Other symptoms and signs involving the musculoskeletal system: Secondary | ICD-10-CM

## 2016-09-07 DIAGNOSIS — R9089 Other abnormal findings on diagnostic imaging of central nervous system: Secondary | ICD-10-CM | POA: Insufficient documentation

## 2016-09-07 DIAGNOSIS — R292 Abnormal reflex: Secondary | ICD-10-CM | POA: Diagnosis not present

## 2016-09-07 DIAGNOSIS — M21371 Foot drop, right foot: Secondary | ICD-10-CM

## 2016-09-07 NOTE — Progress Notes (Signed)
[facility]  PATIENT: Catherine Grant DOB: 1961/08/27  REFERRING DOCTOR OR PCP:  Marikay Alaravid Jones [facility] SOURCE: patient, records from Dr. Yetta BarreJones and Dr. Murray HodgkinsBartko, imaging reports, images on PACS. _________________________________   HISTORICAL  CHIEF COMPLAINT:  Chief Complaint  Patient presents with  . Right leg weakness    Sts. right leg weakness/foot drop are about the same.  She continues to participate in physical therapy.  Would like to discuss MRI results/fim    HISTORY OF PRESENT ILLNESS:  Catherine LuoDemetrius Grant is a 55 yo woman with progressive right leg weakness and gait difficulties.    She first noticed some difficulty with gait and clumsiness of the right leg about a year ago and feels that the symptoms have slowly worsened since then. She last went jogging round the end of 2016 the difficulties. She felt the symptoms were minimal until mid 2017. She denies any numbness. She does have some pain in the leg as well as in the hip.   She denies any weakness numbness or clumsiness in the left leg. She denies any weakness numbness or clumsiness in the arms.    She denies any difficulty with bladder function or bowel function. She has not had any changes in vision.  She feels she has done about the same as the last visit about 6 weeks ago. I eviewed the MRIs of her brain and spine in her presence. The MRI of the brain shows a focus in the left middle cerebellar peduncle.   Its appearance and location could be consistent with MS. Elsewhere, she has about a half dozen T2/FLAIR hyperintense foci that are more nonspecific in the hemispheres. The spinal cord did not show any definite lesions and degenerative changes are mild in the cervical and thoracic spine.  In earlyy 2017, she began to experience back and right leg pain. MRI of the lumbar spine showed spondylolisthesis at L4-L5 associated with disc protrusion. She underwent fusion at L4-L5 by Dr. Yetta BarreJones in  March 2017. She had noticed just a little bit of weakness in the leg but over the past 4-5 months she is noting more weakness in the leg. Specifically when she walks she will drag the foot some.  On 03/22/2016, she had an EMG/NCV by Dr. Murray HodgkinsBartko. It did not find any evidence of neuropathy or radiculopathy.   She had some pain so she underwent an epidural steroid injection and trochanteric bursa injection but they only helped the pain and not get the symptoms.  She has a cousin with MS.    REVIEW OF SYSTEMS: Constitutional: No fevers, chills, sweats, or change in appetite Eyes: No visual changes, double vision, eye pain Ear, nose and throat: No hearing loss, ear pain, nasal congestion, sore throat Cardiovascular: No chest pain, palpitations Respiratory: No shortness of breath at rest or with exertion.   No wheezes GastrointestinaI: No nausea, vomiting, diarrhea, abdominal pain, fecal incontinence Genitourinary: No dysuria, urinary retention or frequency.  No nocturia. Musculoskeletal: as above Integumentary: No rash, pruritus, skin lesions Neurological: as above Psychiatric: No depression at this time.  No anxiety Endocrine: No palpitations, diaphoresis, change in appetite, change in weigh or increased thirst Hematologic/Lymphatic: No anemia, purpura, petechiae. Allergic/Immunologic: No itchy/runny eyes, nasal congestion, recent allergic reactions, rashes  ALLERGIES: Allergies  Allergen Reactions  . Bupropion Rash    HOME MEDICATIONS:  Current Outpatient Prescriptions:  .  baclofen (LIORESAL) 10 MG tablet, Take 1 tablet (10 mg total) by mouth 3 (three) times daily., Disp: 90 each,  Rfl: 6 .  gabapentin (NEURONTIN) 300 MG capsule, Take 300-600 mg by mouth 2 (two) times daily. Take 1 capsule in the morning Take 2 capsules at night, Disp: , Rfl:  .  simvastatin (ZOCOR) 80 MG tablet, Take 40 mg by mouth daily., Disp: , Rfl:  .  HYDROcodone-acetaminophen (NORCO/VICODIN) 5-325 MG tablet,  Take 2 tablets by mouth every 4 (four) hours as needed. (Patient not taking: Reported on 09/07/2016), Disp: 90 tablet, Rfl: 0 .  LORazepam (ATIVAN) 1 MG tablet, Take 2 or 3 before MRI (Patient not taking: Reported on 09/07/2016), Disp: 3 tablet, Rfl: 0 .  losartan (COZAAR) 50 MG tablet, , Disp: , Rfl: 1 .  methocarbamol (ROBAXIN) 500 MG tablet, Take 1 tablet (500 mg total) by mouth every 6 (six) hours as needed for muscle spasms. (Patient not taking: Reported on 09/07/2016), Disp: 60 tablet, Rfl: 1 .  predniSONE (DELTASONE) 10 MG tablet, 6,5,4,3,2,1 taper (Patient not taking: Reported on 09/07/2016), Disp: 21 tablet, Rfl: 0  PAST MEDICAL HISTORY: Past Medical History:  Diagnosis Date  . Hyperlipidemia   . Pneumohemothorax    15 - 20  YRS AGO   CT PLACED  . PONV (postoperative nausea and vomiting)     PAST SURGICAL HISTORY: Past Surgical History:  Procedure Laterality Date  . ABDOMINAL HYSTERECTOMY     partial  . BREAST LUMPECTOMY     RT      1981   . CHEST TUBE INSERTION    . TRANSFORAMINAL LUMBAR INTERBODY FUSION (TLIF) WITH PEDICLE SCREW FIXATION 1 LEVEL N/A 01/26/2016   Procedure: L4-5 TRANSFORAMINAL LUMBAR INTERBODY FUSION (TLIF) WITH PEDICLE SCREW FIXATION ;  Surgeon: Tia Alertavid S Jones, MD;  Location: [facility];  Service: Neurosurgery;  Laterality: N/A;  L4-5 TRANSFORAMINAL LUMBAR INTERBODY FUSION (TLIF) WITH PEDICLE SCREW FIXATION     FAMILY HISTORY: Family History  Problem Relation Age of Onset  . Cancer Father     Lung CA  . Lung cancer Father   . Cancer Sister     Breast CA  . Glaucoma Mother     SOCIAL HISTORY:  Social History   Social History  . Marital status: Married    Spouse name: N/A  . Number of children: N/A  . Years of education: N/A   Occupational History  . Not on file.   Social History Main Topics  . Smoking status: Never Smoker  . Smokeless tobacco: Never Used  . Alcohol use No  . Drug use: No  . Sexual activity: Not on file   Other  Topics Concern  . Not on file   Social History Narrative  . No narrative on file     PHYSICAL EXAM  Vitals:   09/07/16 0955  BP: 116/74  Pulse: 64  Resp: 14  Weight: 169 lb (76.7 kg)  Height: 5\' 3"  (1.6 m)    Body mass index is 29.94 kg/m.   General: The patient is well-developed and well-nourished and in no acute distress   Musculoskeletal:  Back is tender over the right piriformis muscle. The hip is tender over the trochanteric bursa on the right.  Neurologic Exam  Mental status: The patient is alert and oriented x 3 at the time of the examination. The patient has apparent normal recent and remote memory, with an apparently normal attention span and concentration ability.   Speech is normal.  Cranial nerves: Extraocular movements are full. Pupils are equal, round, and reactive to light and accomodation.  Visual fields are  full.  Facial symmetry is present. There is good facial sensation to soft touch bilaterally.Facial strength is normal.  Trapezius and sternocleidomastoid strength is normal. No dysarthria is noted.  The tongue is midline, and the patient has symmetric elevation of the soft palate. No obvious hearing deficits are noted.  Motor:  Muscle bulk is normal.   Tone appears normal. Strength is  5 / 5 in all 4 extremities.   Sensory: Sensory testing is intact to pinprick, soft touch and vibration sensation in all 4 extremities.  Coordination: Cerebellar testing reveals good finger-nose-finger and heel-to-shin bilaterally.  Gait and station: Station is normal.   Gait shows right foot drop and she has a reduced stride. Tandem gait is poor. Romberg is negative.   Reflexes: Deep tendon reflexes are symmetric and normal bilaterally in arms and left leg but she has 2 beats of clonus with spread at the right knee. She had 2-3 beats of clonus at the right ankle.Marland Kitchen      DIAGNOSTIC DATA (LABS, IMAGING, TESTING) - I reviewed patient records, labs, notes, testing and  imaging myself where available.  Lab Results  Component Value Date   WBC 4.6 01/19/2016   HGB 12.6 01/19/2016   HCT 40.5 01/19/2016   MCV 95.5 01/19/2016   PLT 241 01/19/2016      Component Value Date/Time   NA 143 01/19/2016 1022   K 4.5 01/19/2016 1022   CL 111 01/19/2016 1022   CO2 23 01/19/2016 1022   GLUCOSE 78 01/19/2016 1022   BUN 14 01/19/2016 1022   CREATININE 1.12 (H) 01/19/2016 1022   CALCIUM 9.6 01/19/2016 1022   GFRNONAA 55 (L) 01/19/2016 1022   GFRAA >60 01/19/2016 1022       ASSESSMENT AND PLAN  Hyperreflexia - Plan: ANA w/Reflex, B. burgdorfi antibodies, Sedimentation rate, Vitamin B12, Pan-ANCA, Angiotensin converting enzyme, Vitamin B6  Myelopathy (HCC) - Plan: ANA w/Reflex, B. 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 We went over different possible plans for further evaluation and she is agreeable to having a lumbar puncture. If she has oligoclonal bands and/or elevated IgG index, then primary progressive MS is her most likely diagnosis. I will check lab work for vasculitis or Lyme sarcoid and other possible processes that can lead to MRI changes and symptoms.  She will return to see me in 5 weeks but call sooner if she notes significant worsening of her symptoms during the interim period.   We will call her with the results of the studies and bring her back sooner if needed.  45 minute face-to-face evaluation with greater than one half the time, counseling or coordinating care about her symptoms and possible diagnoses and evaluation plans and possible therapies based on outcomes.  Catherine Grant A. Epimenio Foot, MD, PhD 09/07/2016, 10:13 AM Certified in  Neurology, Clinical Neurophysiology, Sleep Medicine, Pain Medicine and Neuroimaging  [facility] [address] [place], [place] [address] [phone]

## 2016-09-11 ENCOUNTER — Encounter: Payer: Self-pay | Admitting: Physical Therapy

## 2016-09-11 ENCOUNTER — Telehealth: Payer: Self-pay | Admitting: Neurology

## 2016-09-11 ENCOUNTER — Ambulatory Visit (INDEPENDENT_AMBULATORY_CARE_PROVIDER_SITE_OTHER): Admitting: Physical Therapy

## 2016-09-11 DIAGNOSIS — M5441 Lumbago with sciatica, right side: Secondary | ICD-10-CM

## 2016-09-11 DIAGNOSIS — R2689 Other abnormalities of gait and mobility: Secondary | ICD-10-CM | POA: Diagnosis not present

## 2016-09-11 DIAGNOSIS — G8929 Other chronic pain: Secondary | ICD-10-CM

## 2016-09-11 DIAGNOSIS — M6281 Muscle weakness (generalized): Secondary | ICD-10-CM

## 2016-09-11 LAB — VITAMIN B12: VITAMIN B 12: 654 pg/mL (ref 211–946)

## 2016-09-11 LAB — PAN-ANCA
ANCA Proteinase 3: <3.5 U/mL (ref 0.0–3.5)
Atypical pANCA: <1:20 {titer}
Myeloperoxidase Ab: <9 U/mL (ref 0.0–9.0)

## 2016-09-11 LAB — ANGIOTENSIN CONVERTING ENZYME: ANGIO CONVERT ENZYME: 48 U/L (ref 14–82)

## 2016-09-11 LAB — SEDIMENTATION RATE: Sed Rate: 8 mm/hr (ref 0–40)

## 2016-09-11 LAB — VITAMIN B6: VITAMIN B6: 29.6 ug/L (ref 2.0–32.8)

## 2016-09-11 LAB — ANA W/REFLEX: ANA: NEGATIVE

## 2016-09-11 NOTE — Therapy (Addendum)
[place], [place], [address] Phone: [phone]   Fax:  [phone]  Physical Therapy Treatment  Patient Details  Name: Catherine Grant MRN: [id number] Date of Birth: Apr 11, 1961 Referring Provider: Dr Sherley Bounds  Encounter Date: 09/11/2016      PT End of Session - 09/11/16 1330    Visit Number 18   Number of Visits 20   Date for PT Re-Evaluation 09/13/16   PT Start Time 0936   PT Stop Time 1018   PT Time Calculation (min) 42 min   Activity Tolerance Patient tolerated treatment well   Behavior During Therapy [facility] for tasks assessed/performed      Past Medical History:  Diagnosis Date  . Hyperlipidemia   . Pneumohemothorax    15 - 20  YRS AGO   CT PLACED  . PONV (postoperative nausea and vomiting)     Past Surgical History:  Procedure Laterality Date  . ABDOMINAL HYSTERECTOMY     partial  . BREAST LUMPECTOMY     RT      1981   . CHEST TUBE INSERTION    . TRANSFORAMINAL LUMBAR INTERBODY FUSION (TLIF) WITH PEDICLE SCREW FIXATION 1 LEVEL N/A 01/26/2016   Procedure: L4-5 TRANSFORAMINAL LUMBAR INTERBODY FUSION (TLIF) WITH PEDICLE SCREW FIXATION ;  Surgeon: Eustace Moore, MD;  Location: [place] [facility];  Service: Neurosurgery;  Laterality: N/A;  L4-5 TRANSFORAMINAL LUMBAR INTERBODY FUSION (TLIF) WITH PEDICLE SCREW FIXATION     There were no vitals filed for this visit.      Subjective Assessment - 09/11/16 0939    Subjective Pt reports that there was a spot on her brain scan but they are not sure what it is yet. To get a spinal tap coming up and doing more blood work. Overall doing good, went to [place] [employer] [place] yesterday with a lot of walking.    Currently in Pain? No/denies   Aggravating Factors  walking   Pain Relieving Factors rest                                 PT Education - 09/11/16 1330    Education provided Yes   Education Details HEP progression,  stop any exercise that causes pain/discomfort   Person(s) Educated Patient   Methods Explanation   Comprehension Verbalized understanding          PT Short Term Goals - 08/07/16 0939      PT SHORT TERM GOAL #1   Title I with initial HEP ( 07/23/16   Time 4   Status Achieved     PT SHORT TERM GOAL #2   Title demo Rt hip strength =/> 4+/5 throughout ( 07/23/16)    Status Achieved     PT SHORT TERM GOAL #3   Title improve FOTO =/< 49% limited ( 07/23/16)    Status On-going           PT Long Term Goals - 09/11/16 1335      PT LONG TERM GOAL #1   Title I with advanced HEP ( 09/13/16)    Time 3   Period Weeks   Status Achieved     PT LONG TERM GOAL #2   Title demo Rt knee and ankle strength =/> 5-/5 ( 08/22/16)    Status Achieved     PT LONG TERM GOAL #3   Title report overall pain reduction =/>  75% in Rt LE ( 08/22/16)    Status Achieved     PT LONG TERM GOAL #4   Title ambulate on even /uneven surface without obvious gait deviations ( 09/13/16)    Time 3   Period Weeks   Status On-going     PT LONG TERM GOAL #5   Title improve FOTO =/< 39% limited ( 09/13/16)    Status Achieved     PT LONG TERM GOAL #6   Title improve Rt ankle dorsiflexion =/> 12 degrees and eversion =/> 20 degrees ( 08/22/16)    Status Achieved     PT LONG TERM GOAL #7   Title report Rt foot pain decrease =/> 75% with walking ( 09/13/16)    Time 3   Period Weeks   Status On-going               Plan - 09/11/16 1332    Clinical Impression Statement Pt is making gradual progress with PT intervention but continuing to have deficits with strength and gait. Pt is currently undergoing further medical testing regarding her ongoing symptoms. Upon discussion with the pt, it was decided to have the pt continue on with her HEP over the next 2 weeks as the testing continues. If the pt is able to continue on with her HEP after this time, she will be D/C from PT services. She will call with any  problems or concerns going forward. Pt is in agrement.    Rehab Potential Good   PT Frequency 2x / week   PT Duration 8 weeks   PT Treatment/Interventions Moist Heat;Ultrasound;Therapeutic exercise;Dry needling;Taping;Balance training;Neuromuscular re-education;Cryotherapy;Gait training;Electrical Stimulation;Patient/family education   PT Next Visit Plan Pt to trial continuation with HEP.   Consulted and Agree with Plan of Care Patient      Patient will benefit from skilled therapeutic intervention in order to improve the following deficits and impairments:  Abnormal gait, Difficulty walking, Increased muscle spasms, Pain, Improper body mechanics, Decreased strength  Visit Diagnosis: Other abnormalities of gait and mobility  Muscle weakness (generalized)  Chronic bilateral low back pain with right-sided sciatica     Problem List Patient Active Problem List   Diagnosis Date Noted  . Myelopathy ([place]) 09/07/2016  . Abnormal brain MRI 09/07/2016  . Right foot drop 07/27/2016  . Gait disturbance 07/27/2016  . 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 S/P lumbar spinal fusion 01/26/2016    Linard Millers, PT, CSCS 09/12/2016, 1:56 PM  [facility] [place], [place], [address] Phone: [phone]   Fax:  [phone]  Name: Catherine Grant MRN: [id number] Date of Birth: 1961-05-05  PHYSICAL THERAPY DISCHARGE SUMMARY  Visits from Start of Care: 18  Current functional level related to goals / functional outcomes: As noted above.   Remaining deficits: As noted above.   Education / Equipment: As noted above.   Plan: Patient agrees to discharge.  Patient goals were partially met. Patient is being discharged due to the patient's request.  ?????     Cassell Clement, PT, CSCS

## 2016-09-11 NOTE — Telephone Encounter (Signed)
Patient is calling regarding scheduling a spinal tap.

## 2016-09-12 NOTE — Telephone Encounter (Signed)
spoke to Patient [facility]   Patient also is asking about her labs. I relayed to Patient that Faith would call her in next 24 hours between patient's . Patient was fine with this and understood details.

## 2016-09-12 NOTE — Telephone Encounter (Signed)
I have spoken with Catherine Grant this morning and reviewed lab results with her/fim

## 2016-09-13 ENCOUNTER — Encounter: Admitting: Physical Therapy

## 2016-09-14 ENCOUNTER — Telehealth: Payer: Self-pay | Admitting: *Deleted

## 2016-09-14 NOTE — Telephone Encounter (Signed)
I spoke with Catherine Grant on 11-14 and reviewed lab results with her/fim

## 2016-09-14 NOTE — Telephone Encounter (Signed)
-----   Message from Asa Lenteichard A Sater, MD sent at 09/14/2016  9:02 AM EST ----- Please let her know that the lab work looks good.

## 2016-09-18 ENCOUNTER — Encounter: Admitting: Physical Therapy

## 2016-09-24 ENCOUNTER — Encounter: Admitting: Physical Therapy

## 2016-09-25 ENCOUNTER — Other Ambulatory Visit: Payer: Self-pay | Admitting: Neurology

## 2016-09-25 ENCOUNTER — Ambulatory Visit
Admission: RE | Admit: 2016-09-25 | Discharge: 2016-09-25 | Disposition: A | Discharge status: Home / Self Care | Source: Ambulatory Visit | Attending: Neurology | Admitting: Neurology

## 2016-09-25 VITALS — BP 109/68 | HR 62

## 2016-09-25 DIAGNOSIS — M21371 Foot drop, right foot: Secondary | ICD-10-CM

## 2016-09-25 DIAGNOSIS — R269 Unspecified abnormalities of gait and mobility: Secondary | ICD-10-CM

## 2016-09-25 DIAGNOSIS — R29898 Other symptoms and signs involving the musculoskeletal system: Secondary | ICD-10-CM

## 2016-09-25 DIAGNOSIS — R9089 Other abnormal findings on diagnostic imaging of central nervous system: Secondary | ICD-10-CM

## 2016-09-25 LAB — CSF CELL COUNT WITH DIFFERENTIAL
RBC COUNT CSF: 13 {cells}/uL — AB (ref 0–10)
WBC CSF: 0 {cells}/uL (ref 0–5)

## 2016-09-25 LAB — PROTEIN, CSF: TOTAL PROTEIN, CSF: 26 mg/dL (ref 15–45)

## 2016-09-25 LAB — GLUCOSE, CSF: Glucose, CSF: 56 mg/dL (ref 43–76)

## 2016-09-25 NOTE — Discharge Instructions (Signed)

## 2016-09-25 NOTE — Progress Notes (Signed)
One SST tube of blood drawn for LP labs from right [facility] space without difficulty.  Donell SievertJeanne Nyquan Selbe, RN

## 2016-09-27 ENCOUNTER — Encounter: Admitting: Physical Therapy

## 2016-09-27 LAB — VDRL, CSF: SYPHILIS VDRL QUANT CSF: NONREACTIVE

## 2016-09-28 LAB — LYME AB/WESTERN BLOT REFLEX: B burgdorferi Ab IgG+IgM: <0.9 Index (ref <0.90)

## 2016-10-01 LAB — CNS IGG SYNTHESIS RATE, CSF+BLOOD
Albumin, CSF: 13.7 mg/dL (ref 8.0–42.0)
Albumin, Serum(Neph): 3.6 g/dL (ref 3.5–4.9)
IgG Index, CSF: 0.35 (ref <0.66)
IgG, CSF: 0.9 mg/dL (ref 0.8–7.7)
IgG, Serum: 671 mg/dL — ABNORMAL LOW (ref 694–1618)
MS CNS IGG SYNTHESIS RATE: -3.8 mg/(24.h) (ref <=3.3)

## 2016-10-02 LAB — OLIGOCLONAL BANDS, CSF + SERM

## 2016-10-04 ENCOUNTER — Telehealth: Payer: Self-pay | Admitting: Neurology

## 2016-10-04 NOTE — Telephone Encounter (Signed)
-----   Message from Asa Lenteichard A Sater, MD sent at 10/03/2016  8:54 AM EST ----- Please let her know that the spinal fluid was normal. That reduces the likelihood that this is MS I still want to check another MRI of her brain in another 5 months or so to see if there has been any changes.

## 2016-10-04 NOTE — Telephone Encounter (Signed)
I have spoken with Catherine Grant this morning and per RAS, explained that csf was normal, which reduces the liklihood of MS. He still would like to recheck an MRI brain in 5 mos. to look for changes.  She verbalized understanding of same, sts. she is having more pain in bilat legs. (Right leg has been her problem, but now she is having pain in left leg/hip as well, she thinks due to compensation).  Sts. she is also haing more tingling in her right foot, right hand, and electric sensation in her lower back.  She is currently taking Baclofen 10mg  po tid and Gabapentin 300mg  po tid.  She has an appt. with RAS next Friday.  Will check with RAS when he returns to the office tomorrow to see if he would like to work her in sooner, adjust meds, or order further testing.  I will call Tzirel back tomorrow/fim

## 2016-10-04 NOTE — Telephone Encounter (Signed)
Pt request lab and CSF results

## 2016-10-05 ENCOUNTER — Encounter: Payer: Self-pay | Admitting: Neurology

## 2016-10-05 ENCOUNTER — Ambulatory Visit (INDEPENDENT_AMBULATORY_CARE_PROVIDER_SITE_OTHER): Admitting: Neurology

## 2016-10-05 VITALS — BP 118/78 | HR 66 | Resp 14 | Ht 63.0 in | Wt 170.0 lb

## 2016-10-05 DIAGNOSIS — R9089 Other abnormal findings on diagnostic imaging of central nervous system: Secondary | ICD-10-CM | POA: Diagnosis not present

## 2016-10-05 DIAGNOSIS — R269 Unspecified abnormalities of gait and mobility: Secondary | ICD-10-CM

## 2016-10-05 DIAGNOSIS — E559 Vitamin D deficiency, unspecified: Secondary | ICD-10-CM | POA: Diagnosis not present

## 2016-10-05 DIAGNOSIS — M21371 Foot drop, right foot: Secondary | ICD-10-CM

## 2016-10-05 DIAGNOSIS — R292 Abnormal reflex: Secondary | ICD-10-CM

## 2016-10-05 DIAGNOSIS — R7989 Other specified abnormal findings of blood chemistry: Secondary | ICD-10-CM | POA: Insufficient documentation

## 2016-10-05 MED ORDER — BACLOFEN 10 MG PO TABS: 10.0000 mg | ORAL_TABLET | Freq: Three times a day (TID) | ORAL | 6 refills | Status: DC

## 2016-10-05 MED ORDER — BACLOFEN 10 MG PO TABS: 20.0000 mg | ORAL_TABLET | Freq: Three times a day (TID) | ORAL | 6 refills | Status: DC

## 2016-10-05 NOTE — Progress Notes (Signed)
[facility]  PATIENT: Catherine Grant DOB: [birth year]  REFERRING DOCTOR OR PCP:  Marikay Alaravid Jones [facility]) SOURCE: patient, records from Dr. Yetta BarreJones and Dr. Murray HodgkinsBartko, imaging reports, images on PACS. _________________________________   HISTORICAL  CHIEF COMPLAINT:  Chief Complaint  Patient presents with  . Gait Disturbance    Sts. she continues to  have right leg weakness that affects her gait.  Now also having left hip pain, she thinks due to over compensation. She would like to discuss test results, causes for her sx/fim    HISTORY OF PRESENT ILLNESS:  Catherine LuoDemetrius Odom is a 55 yo woman with progressive right leg weakness and gait difficulties.    Since last visit she has had a lumbar puncture.   . I personally reviewed the MRI images and showed them to her. The MRI of the brain shows a focus in the left into her cerebellar peduncle adjacent to the fourth ventricle. That focus is concerning for demyelination. However, the other half dozen abnormal foci were small round subcortical lesions that are much more nonspecific and more likely due to minimal chronic microvascular ischemic change. The MRI of the spinal cord did not show any definite lesions. The lumbar puncture showed CSF was normal. I went over the significance of her history (slowly progressive right greater than left leg weakness over the past one to two years) and her studies. Though I am concerned about the possibility of primary progressive MS, with negative CSF and no definite foci in the spinal cord, this is only a possible diagnosis and I would not recommend treatment at this point. However, I do think we need to recheck imaging studies in another 6-9 months and reconsider the diagnosis if there are changes. I also offered to refer her for a second opinion in this regard.  She reports that she initially felt she did a little better with the baclofen but now is not certain she is getting a benefit.  She is on 10 mg by mouth 3 times a day. She does have a professionally made brace for the right ankle.   She feels she walks slightly better with it she remains active but was much more active a couple years ago when she was exercising on a regular basis. Gained about 10 pounds since her weakness has started.  She has a cousin with MS.  History:   She first noticed some difficulty with gait and clumsiness of the right leg about a year ago and feels that the symptoms have slowly worsened since then. She last went jogging round the end of 2016 the difficulties. She felt the symptoms were minimal until mid 2017. She denies any numbness. She does have some pain in the leg as well as in the hip.   She denies any weakness numbness or clumsiness in the left leg. She denies any weakness numbness or clumsiness in the arms.    She denies any difficulty with bladder function or bowel function. She has not had any changes in vision.   The MRI of the brain shows a focus in the left middle cerebellar peduncle.   Its appearance and location could be consistent with MS. Elsewhere, she has about a half dozen T2/FLAIR hyperintense foci that are more nonspecific in the hemispheres. The spinal cord did not show any definite lesions and degenerative changes are mild in the cervical and thoracic spine.  Lower Back:  In earlyy 2017, she began to experience back and right leg pain. MRI of  the lumbar spine showed spondylolisthesis at L4-L5 associated with disc protrusion. She underwent fusion at L4-L5 by Dr. Yetta Barre in March 2017. She had noticed just a little bit of weakness in the leg but over the past 4-5 months she is noting more weakness in the leg. Specifically when she walks she will drag the foot some.  On 03/22/2016, she had an EMG/NCV by Dr. Murray Hodgkins. It did not find any evidence of neuropathy or radiculopathy.   She had some pain so she underwent an epidural steroid injection and trochanteric bursa injection but they only helped  the pain and not get the symptoms.      REVIEW OF SYSTEMS: Constitutional: No fevers, chills, sweats, or change in appetite Eyes: No visual changes, double vision, eye pain Ear, nose and throat: No hearing loss, ear pain, nasal congestion, sore throat Cardiovascular: No chest pain, palpitations Respiratory: No shortness of breath at rest or with exertion.   No wheezes GastrointestinaI: No nausea, vomiting, diarrhea, abdominal pain, fecal incontinence Genitourinary: No dysuria, urinary retention or frequency.  No nocturia. Musculoskeletal: as above Integumentary: No rash, pruritus, skin lesions Neurological: as above Psychiatric: No depression at this time.  No anxiety Endocrine: No palpitations, diaphoresis, change in appetite, change in weigh or increased thirst Hematologic/Lymphatic: No anemia, purpura, petechiae. Allergic/Immunologic: No itchy/runny eyes, nasal congestion, recent allergic reactions, rashes  ALLERGIES: Allergies  Allergen Reactions  . Bupropion Rash    HOME MEDICATIONS:  Current Outpatient Prescriptions:  .  baclofen (LIORESAL) 10 MG tablet, Take 1 tablet (10 mg total) by mouth 3 (three) times daily., Disp: 90 each, Rfl: 6 .  gabapentin (NEURONTIN) 300 MG capsule, Take 300-600 mg by mouth 2 (two) times daily. Take 1 capsule in the morning Take 2 capsules at night, Disp: , Rfl:  .  losartan (COZAAR) 50 MG tablet, , Disp: , Rfl: 1 .  methocarbamol (ROBAXIN) 500 MG tablet, Take 1 tablet (500 mg total) by mouth every 6 (six) hours as needed for muscle spasms., Disp: 60 tablet, Rfl: 1 .  predniSONE (DELTASONE) 10 MG tablet, 6,5,4,3,2,1 taper, Disp: 21 tablet, Rfl: 0 .  simvastatin (ZOCOR) 80 MG tablet, Take 40 mg by mouth daily., Disp: , Rfl:  .  HYDROcodone-acetaminophen (NORCO/VICODIN) 5-325 MG tablet, Take 2 tablets by mouth every 4 (four) hours as needed. (Patient not taking: Reported on 10/05/2016), Disp: 90 tablet, Rfl: 0 .  LORazepam (ATIVAN) 1 MG tablet,  Take 2 or 3 before MRI (Patient not taking: Reported on 10/05/2016), Disp: 3 tablet, Rfl: 0  PAST MEDICAL HISTORY: Past Medical History:  Diagnosis Date  . Hyperlipidemia   . Pneumohemothorax    15 - 20  YRS AGO   CT PLACED  . PONV (postoperative nausea and vomiting)     PAST SURGICAL HISTORY: Past Surgical History:  Procedure Laterality Date  . ABDOMINAL HYSTERECTOMY     partial  . BREAST LUMPECTOMY     RT      1981   . CHEST TUBE INSERTION    . TRANSFORAMINAL LUMBAR INTERBODY FUSION (TLIF) WITH PEDICLE SCREW FIXATION 1 LEVEL N/A 01/26/2016   Procedure: L4-5 TRANSFORAMINAL LUMBAR INTERBODY FUSION (TLIF) WITH PEDICLE SCREW FIXATION ;  Surgeon: Tia Alert, MD;  Location: [facility];  Service: Neurosurgery;  Laterality: N/A;  L4-5 TRANSFORAMINAL LUMBAR INTERBODY FUSION (TLIF) WITH PEDICLE SCREW FIXATION     FAMILY HISTORY: Family History  Problem Relation Age of Onset  . Cancer Father     Lung CA  . Lung  cancer Father   . Cancer Sister     Breast CA  . Glaucoma Mother     SOCIAL HISTORY:  Social History   Social History  . Marital status: Married    Spouse name: N/A  . Number of children: N/A  . Years of education: N/A   Occupational History  . Not on file.   Social History Main Topics  . Smoking status: Never Smoker  . Smokeless tobacco: Never Used  . Alcohol use No  . Drug use: No  . Sexual activity: Not on file   Other Topics Concern  . Not on file   Social History Narrative  . No narrative on file     PHYSICAL EXAM  Vitals:   10/05/16 1119  BP: 118/78  Pulse: 66  Resp: 14  Weight: 170 lb (77.1 kg)  Height: 5\' 3"  (1.6 m)    Body mass index is 30.11 kg/m.   General: The patient is well-developed and well-nourished and in no acute distress   Musculoskeletal:  Back is tender over the right piriformis muscle. The hip is tender over the trochanteric bursa on the right.  Neurologic Exam  Mental status: The patient is alert and oriented  x 3 at the time of the examination. The patient has apparent normal recent and remote memory, with an apparently normal attention span and concentration ability.   Speech is normal.  Cranial nerves: Extraocular movements are full. Pupils are equal, round, and reactive to light and accomodation.  Visual fields are full.  Facial symmetry is present. There is good facial sensation to soft touch bilaterally.Facial strength is normal.  Trapezius and sternocleidomastoid strength is normal. No dysarthria is noted.  The tongue is midline, and the patient has symmetric elevation of the soft palate. No obvious hearing deficits are noted.  Motor:  Muscle bulk is normal.   Tone appears mildly increased in right legl. Strength is  5 / 5 in all 4 extremities.   Sensory: Sensory testing is intact to pinprick, soft touch and vibration sensation in all 4 extremities.  Coordination: Cerebellar testing reveals good finger-nose-finger and heel-to-shin bilaterally.  Gait and station: Station is normal.   Gait shows right foot drop and she has a reduced stride. Tandem gait is very poor. Romberg is negative.   Reflexes: Deep tendon reflexes are symmetric and normal bilaterally in arms and left leg but she has spread at the right knee. She had 2-3 beats of clonus at the right ankle.Marland Kitchen      DIAGNOSTIC DATA (LABS, IMAGING, TESTING) - I reviewed patient records, labs, notes, testing and imaging myself where available.  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   I remain concerned about the possibility of MS but she does not meet the 2010 McDonald criteria.  We will recheck an MRI in mid 2018 and see if there has been any changes that would make us more certain of the diagnosis. I'll also go ahead and check the NMO antibody and vitamin D. 2.    Increase baclofen to 20 mg 3 times a day 3.    Maintain active and try to lose weight. Consider phentermine or other medicine to help if unsuccessful at the next visit. 4.    She will return to see me in 3 months but call sooner if she notes significant worsening of her symptoms during the interim period.   We will call her with the results of the studies and bring her back sooner if needed.   Richard A. Epimenio FootSater, MD, PhD 10/05/2016, 11:41 AM Certified in Neurology, Clinical Neurophysiology, Sleep Medicine, Pain Medicine and Neuroimaging  [facility] [address], [address] [place], [place] [id number] [phone]

## 2016-10-05 NOTE — Telephone Encounter (Signed)
I spoke with Maysel earlier this am.  RAS had a cancellation at 1130 this am, so appt. was offered to Pammie.  She is agreeable.  I have added her to the schedule/fim

## 2016-10-08 ENCOUNTER — Ambulatory Visit: Payer: Self-pay | Admitting: Neurology

## 2016-10-08 LAB — NEUROMYELITIS OPTICA AUTOAB, IGG

## 2016-10-08 LAB — VITAMIN D 25 HYDROXY (VIT D DEFICIENCY, FRACTURES): Vit D, 25-Hydroxy: 28.2 ng/mL — ABNORMAL LOW (ref 30.0–100.0)

## 2016-10-09 ENCOUNTER — Telehealth: Payer: Self-pay | Admitting: *Deleted

## 2016-10-09 MED ORDER — VITAMIN D (ERGOCALCIFEROL) 1.25 MG (50000 UNIT) PO CAPS: 50000.0000 [IU] | ORAL_CAPSULE | ORAL | 0 refills | Status: DC

## 2016-10-09 NOTE — Telephone Encounter (Signed)
I have spoken with Jaquasia this afternoon and per RAS, explained vit. d level is low.  He would like her to take rx. Vit. D 50,000iu weekly for 12 weeks, then, once done with rx, take otc Vit. D 5,000u once daily.  She verbalized understanding of same, is agreeable.  [facility]

## 2016-10-09 NOTE — Telephone Encounter (Signed)
-----   Message from Asa Lenteichard A Sater, MD sent at 10/09/2016  9:08 AM EST ----- Plese let her know that the vitamin D was low. Please send in VitD 50,000 units weekly 12 weeks then 5000 units OTC daily.

## 2016-10-12 ENCOUNTER — Ambulatory Visit: Admitting: Neurology

## 2016-12-04 ENCOUNTER — Telehealth: Payer: Self-pay | Admitting: Neurology

## 2016-12-04 MED ORDER — GABAPENTIN 300 MG PO CAPS: ORAL_CAPSULE | ORAL | 5 refills | Status: DC

## 2016-12-04 NOTE — Telephone Encounter (Signed)
I have spoken with Catherine Grant.  She sts. she has been on Gabapentin 300mg  tid since back surgery on 01-26-16.  Per RAS, ok to stop Gabapentin if she is no longer having back/leg pain. However, if she is still having pain, he will r/f for her.  She sts. she still has some right sided hip/leg pain.  She is undecided as to stopping it--may try decreasing to see if she has more pain.  [facility], so if she decides to continue she will have it/fim

## 2016-12-04 NOTE — Telephone Encounter (Signed)
Patient wants to know if she should continue taking gabapentin (NEURONTIN) 300 MG capsule that was prescribed by Dr. Marikay Alaravid Jones who she does not see anymore?

## 2016-12-04 NOTE — Addendum Note (Signed)
Addended by: Candis SchatzMISENHEIMER, Solaris Kram I on: 12/04/2016 02:47 PM   Modules accepted: Orders

## 2017-01-03 ENCOUNTER — Ambulatory Visit (INDEPENDENT_AMBULATORY_CARE_PROVIDER_SITE_OTHER): Admitting: Neurology

## 2017-01-03 ENCOUNTER — Encounter: Payer: Self-pay | Admitting: Neurology

## 2017-01-03 VITALS — BP 119/80 | HR 64 | Ht 63.0 in | Wt 166.0 lb

## 2017-01-03 DIAGNOSIS — E559 Vitamin D deficiency, unspecified: Secondary | ICD-10-CM | POA: Diagnosis not present

## 2017-01-03 DIAGNOSIS — R269 Unspecified abnormalities of gait and mobility: Secondary | ICD-10-CM

## 2017-01-03 DIAGNOSIS — M21371 Foot drop, right foot: Secondary | ICD-10-CM | POA: Diagnosis not present

## 2017-01-03 DIAGNOSIS — R292 Abnormal reflex: Secondary | ICD-10-CM | POA: Diagnosis not present

## 2017-01-03 DIAGNOSIS — R9089 Other abnormal findings on diagnostic imaging of central nervous system: Secondary | ICD-10-CM | POA: Diagnosis not present

## 2017-01-03 DIAGNOSIS — R7989 Other specified abnormal findings of blood chemistry: Secondary | ICD-10-CM

## 2017-01-03 MED ORDER — LORAZEPAM 1 MG PO TABS: ORAL_TABLET | ORAL | 0 refills | Status: DC

## 2017-01-03 NOTE — Progress Notes (Signed)
[facility]  PATIENT: Catherine Grant DOB: 04-17-1961  REFERRING DOCTOR OR PCP:  Marikay Alaravid Jones [facility]) SOURCE: patient, records from Dr. Yetta BarreJones and Dr. Murray HodgkinsBartko, imaging reports, images on PACS. _________________________________   HISTORICAL  CHIEF COMPLAINT:  Chief Complaint  Patient presents with  . Gait Disturbance    Sts. gait is about the same.  Denies new sx./fim    HISTORY OF PRESENT ILLNESS:  Catherine LuoDemetrius Grant is a 56 yo woman with progressive right > left leg weakness and gait difficulties.  She has an abnormal MRI.  She feels the right leg is doing about the same as the last visit.   She stumbles and has one recent fall.   She was at church and she feels she may have been walking too fast.     She has no bladder difficulty and rarely has nocturia.    She denies numbness or weakness elsewhere.       She exercises daily and tries to stays active.  She is taking Vit D supplements.     The MRI of the brain shows a focus in the left middle/superior cerebellar peduncle adjacent to the fourth ventricle. That focus is concerning for demyelination. However, the other half dozen abnormal foci were small round subcortical lesions that are much more nonspecific and more likely due to minimal chronic microvascular ischemic change. The MRI of the spinal cord did not show any definite lesions. The lumbar puncture showed CSF was normal.   Initially, she felt she did a little better with the baclofen but now is not certain she is getting a benefit. She is on 10 mg 3 times a day.  She does have a professionally made brace for the right ankle.   She feels she walks slightly better with it she remains active but was much more active a couple years ago when she was exercising on a regular basis. Gained about 10 pounds since her weakness has started.  She has a cousin with MS.  History:   She first noticed some difficulty with gait and clumsiness of the right  leg about a year ago and feels that the symptoms have slowly worsened since then. She last went jogging round the end of 2016 the difficulties. She felt the symptoms were minimal until mid 2017. She denies any numbness. She does have some pain in the leg as well as in the hip.   She denies any weakness numbness or clumsiness in the left leg. She denies any weakness numbness or clumsiness in the arms.    She denies any difficulty with bladder function or bowel function. She has not had any changes in vision.   The MRI of the brain shows a focus in the left middle cerebellar peduncle.   Its appearance and location could be consistent with MS. Elsewhere, she has about a half dozen T2/FLAIR hyperintense foci that are more nonspecific in the hemispheres. The spinal cord did not show any definite lesions and degenerative changes are mild in the cervical and thoracic spine.  Lower Back:  In earlyy 2017, she began to experience back and right leg pain. MRI of the lumbar spine showed spondylolisthesis at L4-L5 associated with disc protrusion. She underwent fusion at L4-L5 by Dr. Yetta BarreJones in March 2017. She had noticed just a little bit of weakness in the leg but over the past 4-5 months she is noting more weakness in the leg. Specifically when she walks she will drag the foot some.  On  03/22/2016, she had an EMG/NCV by Dr. Murray Hodgkins. It did not find any evidence of neuropathy or radiculopathy.   She had some pain so she underwent an epidural steroid injection and trochanteric bursa injection but they only helped the pain and not get the symptoms.      REVIEW OF SYSTEMS: Constitutional: No fevers, chills, sweats, or change in appetite.   Sleeps ok Eyes: No visual changes, double vision, eye pain Ear, nose and throat: No hearing loss, ear pain, nasal congestion, sore throat Cardiovascular: No chest pain, palpitations Respiratory: No shortness of breath at rest or with exertion.   No wheezes GastrointestinaI: No nausea,  vomiting, diarrhea, abdominal pain, fecal incontinence Genitourinary: No dysuria, urinary retention or frequency.  No nocturia. Musculoskeletal: as above Integumentary: No rash, pruritus, skin lesions Neurological: as above Psychiatric: No depression at this time.  No anxiety Endocrine: No palpitations, diaphoresis, change in appetite, change in weigh or increased thirst Hematologic/Lymphatic: No anemia, purpura, petechiae. Allergic/Immunologic: No itchy/runny eyes, nasal congestion, recent allergic reactions, rashes  ALLERGIES: Allergies  Allergen Reactions  . Bupropion Rash    HOME MEDICATIONS:  Current Outpatient Prescriptions:  .  baclofen (LIORESAL) 10 MG tablet, Take 2 tablets (20 mg total) by mouth 3 (three) times daily., Disp: 180 each, Rfl: 6 .  FLUoxetine (PROZAC) 20 MG capsule, Take 20 mg by mouth., Disp: , Rfl:  .  gabapentin (NEURONTIN) 300 MG capsule, Take one tablet 3 times daily as needed for leg pain., Disp: 90 capsule, Rfl: 5 .  losartan (COZAAR) 50 MG tablet, , Disp: , Rfl: 1 .  simvastatin (ZOCOR) 80 MG tablet, Take 40 mg by mouth daily., Disp: , Rfl:  .  methocarbamol (ROBAXIN) 500 MG tablet, Take 1 tablet (500 mg total) by mouth every 6 (six) hours as needed for muscle spasms. (Patient not taking: Reported on 01/03/2017), Disp: 60 tablet, Rfl: 1  PAST MEDICAL HISTORY: Past Medical History:  Diagnosis Date  . Hyperlipidemia   . Pneumohemothorax    15 - 20  YRS AGO   CT PLACED  . PONV (postoperative nausea and vomiting)     PAST SURGICAL HISTORY: Past Surgical History:  Procedure Laterality Date  . ABDOMINAL HYSTERECTOMY     partial  . BREAST LUMPECTOMY     RT      1981   . CHEST TUBE INSERTION    . TRANSFORAMINAL LUMBAR INTERBODY FUSION (TLIF) WITH PEDICLE SCREW FIXATION 1 LEVEL N/A 01/26/2016   Procedure: L4-5 TRANSFORAMINAL LUMBAR INTERBODY FUSION (TLIF) WITH PEDICLE SCREW FIXATION ;  Surgeon: Tia Alert, MD;  Location: [facility];  Service:  Neurosurgery;  Laterality: N/A;  L4-5 TRANSFORAMINAL LUMBAR INTERBODY FUSION (TLIF) WITH PEDICLE SCREW FIXATION     FAMILY HISTORY: Family History  Problem Relation Age of Onset  . Cancer Father     Lung CA  . Lung cancer Father   . Cancer Sister     Breast CA  . Glaucoma Mother     SOCIAL HISTORY:  Social History   Social History  . Marital status: Married    Spouse name: N/A  . Number of children: N/A  . Years of education: N/A   Occupational History  . Not on file.   Social History Main Topics  . Smoking status: Never Smoker  . Smokeless tobacco: Never Used  . Alcohol use No  . Drug use: No  . Sexual activity: Not on file   Other Topics Concern  . Not on file  Social History Narrative  . No narrative on file     PHYSICAL EXAM  Vitals:   01/03/17 0815  BP: 119/80  Pulse: 64  Weight: 166 lb (75.3 kg)  Height: 5\' 3"  (1.6 m)    Body mass index is 29.41 kg/m.   General: The patient is well-developed and well-nourished and in no acute distress   Neurologic Exam  Mental status: The patient is alert and oriented x 3 at the time of the examination. The patient has apparent normal recent and remote memory, with an apparently normal attention span and concentration ability.   Speech is normal.  Cranial nerves: Extraocular movements are full.  There is good facial sensation to soft touch bilaterally.Facial strength is normal.  Trapezius and sternocleidomastoid strength is normal. No dysarthria is noted.  The tongue is midline, and the patient has symmetric elevation of the soft palate. No obvious hearing deficits are noted.  Motor:  Muscle bulk is normal.   Tone is mildly increased in right leg. Strength is  5 / 5 in all 4 extremities.   Sensory: Sensory testing is intact to pinprick, soft touch and vibration sensation in all 4 extremities.  Coordination: Cerebellar testing reveals good finger-nose-finger and heel-to-shin bilaterally.  Gait and station:  Station is normal.   Gait shows right foot drop and she has a reduced stride. Tandem gait is poor. Romberg is negative.   Reflexes: Deep tendon reflexes are symmetric and normal bilaterally in arms and left leg but she has spread at the right knee. No ankle clonus today.        DIAGNOSTIC DATA (LABS, IMAGING, TESTING) - I reviewed patient records, labs, notes, testing and imaging myself where available.  Lab Results  Component Value Date   WBC 4.6 01/19/2016   HGB 12.6 01/19/2016   HCT 40.5 01/19/2016   MCV 95.5 01/19/2016   PLT 241 01/19/2016      Component Value Date/Time   NA 143 01/19/2016 1022   K 4.5 01/19/2016 1022   CL 111 01/19/2016 1022   CO2 23 01/19/2016 1022   GLUCOSE 78 01/19/2016 1022   BUN 14 01/19/2016 1022   CREATININE 1.12 (H) 01/19/2016 1022   CALCIUM 9.6 01/19/2016 1022   GFRNONAA 55 (L) 01/19/2016 1022   GFRAA >60 01/19/2016 1022       ASSESSMENT AND PLAN  Gait disturbance  Abnormal brain MRI  Hyperreflexia  Low vitamin D level  Right foot drop   1.   Her progression in an abnormal MRI worrisome for primary progressive MS though the normal CSF and normal spinal cord argue against that diagnosis. She needs another brain and cerv spine MRI to determine if any progression assisted with MS or other process.  If additional lesions consistent with MS, consider starting a medication for primary progressive MS..   2.    Continue baclofen to 20 mg 3 times a day 3.    Remain active, exercise and try to lose weight.   4.    She will return to see me in 5-6 months but call sooner if she notes significant worsening of her symptoms during the interim period.   We will call her with the results of the MRI and bring her back sooner if needed.   Fares Ramthun A. Epimenio Foot, MD, PhD 01/03/2017, 8:55 AM Certified in Neurology, Clinical Neurophysiology, Sleep Medicine, Pain Medicine and Neuroimaging  [facility] [address] [place],  [place] [address] [phone]

## 2017-01-28 ENCOUNTER — Ambulatory Visit
Admission: RE | Admit: 2017-01-28 | Discharge: 2017-01-28 | Disposition: A | Discharge status: Home / Self Care | Source: Ambulatory Visit | Attending: Neurology | Admitting: Neurology

## 2017-01-28 DIAGNOSIS — R9089 Other abnormal findings on diagnostic imaging of central nervous system: Secondary | ICD-10-CM

## 2017-01-28 DIAGNOSIS — R292 Abnormal reflex: Secondary | ICD-10-CM | POA: Diagnosis not present

## 2017-01-28 DIAGNOSIS — R269 Unspecified abnormalities of gait and mobility: Secondary | ICD-10-CM | POA: Diagnosis not present

## 2017-01-28 MED ORDER — GADOBENATE DIMEGLUMINE 529 MG/ML IV SOLN
15.0000 mL | Freq: Once | INTRAVENOUS | Status: AC | PRN
  Administered 2017-01-28: 15 mL via INTRAVENOUS

## 2017-01-29 ENCOUNTER — Telehealth: Payer: Self-pay | Admitting: *Deleted

## 2017-01-29 NOTE — Telephone Encounter (Signed)
I have spoken with Catherine Grant today and per RAS, explained that MRI brain, c/s show no changes when compared with her last mri's.  She verbalized understanding of same/fim

## 2017-01-29 NOTE — Telephone Encounter (Signed)
-----   Message from Asa Lente, MD sent at 01/28/2017  6:25 PM EDT ----- Please let her know that the MRI of the brain and cervical spine did not show any new lesions compared to her last one.

## 2017-05-29 ENCOUNTER — Emergency Department (INDEPENDENT_AMBULATORY_CARE_PROVIDER_SITE_OTHER)
Admission: EM | Admit: 2017-05-29 | Discharge: 2017-05-29 | Disposition: A | Discharge status: Home / Self Care | Source: Home / Self Care | Attending: Family Medicine | Admitting: Family Medicine

## 2017-05-29 ENCOUNTER — Telehealth: Payer: Self-pay | Admitting: *Deleted

## 2017-05-29 ENCOUNTER — Encounter: Payer: Self-pay | Admitting: *Deleted

## 2017-05-29 DIAGNOSIS — I951 Orthostatic hypotension: Secondary | ICD-10-CM | POA: Diagnosis not present

## 2017-05-29 DIAGNOSIS — H8110 Benign paroxysmal vertigo, unspecified ear: Secondary | ICD-10-CM | POA: Diagnosis not present

## 2017-05-29 MED ORDER — PREDNISONE 20 MG PO TABS: ORAL_TABLET | ORAL | 0 refills | Status: DC

## 2017-05-29 MED ORDER — MECLIZINE HCL 25 MG PO TABS: 25.0000 mg | ORAL_TABLET | Freq: Three times a day (TID) | ORAL | 0 refills | Status: DC | PRN

## 2017-05-29 MED ORDER — SODIUM CHLORIDE 0.9 % IV BOLUS (SEPSIS)
1000.0000 mL | Freq: Once | INTRAVENOUS | Status: AC
  Administered 2017-05-29: 1000 mL via INTRAVENOUS

## 2017-05-29 MED ORDER — ONDANSETRON 4 MG PO TBDP
4.0000 mg | ORAL_TABLET | Freq: Once | ORAL | Status: AC
  Administered 2017-05-29: 4 mg via ORAL

## 2017-05-29 NOTE — Telephone Encounter (Addendum)
Spoke to pt she reports that her s/s are the same since her visit this morning. Advised her if s/s fail to improve or worsen she should proceed to the ED for further evaluation. Pt agrees. Reports that her s/s are manageable at home right now, but if that changes she will go to the ED.

## 2017-05-29 NOTE — ED Triage Notes (Signed)
Patient c/o 2 weeks of positional dizziness and bilateral ear fullness.

## 2017-05-29 NOTE — Discharge Instructions (Signed)
°  Antivert (meclizine) is a medication to help with dizziness and nausea related to vertigo.  This medication can cause drowsiness. Do not operate heavy machinery or drive while taking.  ° °

## 2017-05-29 NOTE — ED Provider Notes (Signed)
CSN: [id number]     Arrival date & time 05/29/17  0810 History   First MD Initiated Contact with Patient 05/29/17 [phone]     Chief Complaint  Patient presents with  . Dizziness  . Ear Fullness   (Consider location/radiation/quality/duration/timing/severity/associated sxs/prior Treatment) HPI Catherine Grant is a 56 y.o. female presenting to UC with c/o 2 weeks gradually worsening positional dizziness and bilateral ear fullness, associated nausea but no vomiting.  Hx of vertigo several years ago. Symptoms feel similar. She is unsure what caused initial vertigo several years ago.  Denies recent illness or head injury.  She has been eating and drinking well.  Hx of clinical MS but states her head scans have not shown evidence of MS.  She is followed by her PCP and neurologist. She does not believe she has been on steroid treatments for her symptoms in the past.  Denies change in vision.    Past Medical History:  Diagnosis Date  . Hyperlipidemia   . Pneumohemothorax    15 - 20  YRS AGO   CT PLACED  . PONV (postoperative nausea and vomiting)    Past Surgical History:  Procedure Laterality Date  . ABDOMINAL HYSTERECTOMY     partial  . BREAST LUMPECTOMY     RT      1981   . CHEST TUBE INSERTION    . TRANSFORAMINAL LUMBAR INTERBODY FUSION (TLIF) WITH PEDICLE SCREW FIXATION 1 LEVEL N/A 01/26/2016   Procedure: L4-5 TRANSFORAMINAL LUMBAR INTERBODY FUSION (TLIF) WITH PEDICLE SCREW FIXATION ;  Surgeon: Tia Alertavid S Jones, MD;  Location: [facility];  Service: Neurosurgery;  Laterality: N/A;  L4-5 TRANSFORAMINAL LUMBAR INTERBODY FUSION (TLIF) WITH PEDICLE SCREW FIXATION    Family History  Problem Relation Age of Onset  . Cancer Father        Lung CA  . Lung cancer Father   . Cancer Sister        Breast CA  . Glaucoma Mother    Social History  Substance Use Topics  . Smoking status: Never Smoker  . Smokeless tobacco: Never Used  . Alcohol use No   OB History    No data available     Review  of Systems  Constitutional: Negative for chills and fever.  HENT: Positive for ear pain (bilateral fullness). Negative for congestion, sore throat, trouble swallowing and voice change.   Eyes: Negative for photophobia, pain and visual disturbance.  Respiratory: Negative for cough and shortness of breath.   Cardiovascular: Negative for chest pain and palpitations.  Gastrointestinal: Positive for nausea. Negative for abdominal pain, diarrhea and vomiting.  Musculoskeletal: Negative for arthralgias, back pain and myalgias.  Skin: Negative for rash.  Neurological: Positive for dizziness and headaches (mild head pressure). Negative for syncope, facial asymmetry, weakness, light-headedness and numbness.    Allergies  Bupropion  Home Medications   Prior to Admission medications   Medication Sig Start Date End Date Taking? Authorizing Provider  baclofen (LIORESAL) 10 MG tablet Take 2 tablets (20 mg total) by mouth 3 (three) times daily. 10/05/16  Yes Sater, Pearletha Furlichard A, MD  DIAZEPAM PO Take by mouth.   Yes [provider]  gabapentin (NEURONTIN) 300 MG capsule Take one tablet 3 times daily as needed for leg pain. 12/04/16  Yes Sater, Pearletha Furlichard A, MD  losartan (COZAAR) 50 MG tablet  08/23/16  Yes [provider]  simvastatin (ZOCOR) 80 MG tablet Take 40 mg by mouth daily.   Yes [provider]  FLUoxetine (  PROZAC) 20 MG capsule Take 20 mg by mouth. 12/31/16   [provider]  LORazepam (ATIVAN) 1 MG tablet Take 2 or 3 before MRI 01/03/17   Sater, Pearletha Furlichard A, MD  meclizine (ANTIVERT) 25 MG tablet Take 1 tablet (25 mg total) by mouth 3 (three) times daily as needed for dizziness. 05/29/17   Lurene ShadowPhelps, Coltin Casher O, PA-C  predniSONE (DELTASONE) 20 MG tablet 3 tabs po day one, then 2 po daily x 4 days 05/29/17   Lurene ShadowPhelps, Jamela Cumbo O, PA-C   Meds Ordered and Administered this Visit   Medications  ondansetron (ZOFRAN-ODT) disintegrating tablet 4 mg (4 mg Oral Given 05/29/17 0856)  sodium  chloride 0.9 % bolus 1,000 mL (0 mLs Intravenous Stopped 05/29/17 1028)    BP (!) 146/91 (BP Location: Left Arm)   Pulse (!) 56   Temp 98.3 F (36.8 C) (Oral)   Wt 168 lb (76.2 kg)   SpO2 100%   BMI 29.76 kg/m  Orthostatic VS for the past 24 hrs:  BP- Lying Pulse- Lying BP- Sitting Pulse- Sitting BP- Standing at 0 minutes Pulse- Standing at 0 minutes  05/29/17 1031 128/82 (!) 49 135/84 52 117/74 67  05/29/17 0837 133/81 54 124/85 74 115/75 86    Physical Exam  Constitutional: She is oriented to person, place, and time. She appears well-developed and well-nourished. No distress.  HENT:  Head: Normocephalic and atraumatic.  Right Ear: Tympanic membrane is bulging. Tympanic membrane is not erythematous. A middle ear effusion is present.  Left Ear: Tympanic membrane is bulging. Tympanic membrane is not erythematous. A middle ear effusion is present.  Nose: Nose normal.  Mouth/Throat: Uvula is midline, oropharynx is clear and moist and mucous membranes are normal.  Eyes: Pupils are equal, round, and reactive to light. Right eye exhibits nystagmus. Left eye exhibits nystagmus.  Neck: Normal range of motion. Neck supple.  Cardiovascular: Normal rate and regular rhythm.   Pulmonary/Chest: Effort normal and breath sounds normal. No stridor. No respiratory distress. She has no wheezes. She has no rales.  Musculoskeletal: Normal range of motion.  Lymphadenopathy:    She has no cervical adenopathy.  Neurological: She is alert and oriented to person, place, and time. No cranial nerve deficit.  Face is symmetric. Speech is clear. Alert to person place and time. Normal finger to nose coordination.   Skin: Skin is warm and dry. She is not diaphoretic.  Psychiatric: She has a normal mood and affect. Her behavior is normal.  Nursing note and vitals reviewed.   Urgent Care Course     Procedures (including critical care time)  Labs Review Labs Reviewed - No data to display  Imaging  Review No results found.   MDM   1. Benign paroxysmal positional vertigo, unspecified laterality   2. Orthostatic hypotension     Hx and exam c/w BPPV  Pt does have mild orthostatic hypotension.  1L IV Fluids and 4mg  zofran ODT given  Pt states she feels mildly improved. Still has some dizziness with head movements. HR did go down to 49 at one point but at time of discharge HR back up to 54bpm. Pt states her HR is usually on the low side.  She feels comfortable being discharged home.  Rx: Meclizine and prednisone Will call pt later today to check on her. F/u with PCP in 2-3 days if not improving. Discussed symptoms that warrant emergent care in the ED.       Lurene Shadowhelps, Makinze Jani O, PA-C 05/29/17 1050

## 2017-05-31 ENCOUNTER — Telehealth: Payer: Self-pay | Admitting: *Deleted

## 2017-05-31 NOTE — Telephone Encounter (Signed)
Callback: No answer, LMOM f/u from visit. Call back as needed. 

## 2017-06-06 ENCOUNTER — Telehealth: Payer: Self-pay | Admitting: Neurology

## 2017-06-06 NOTE — Telephone Encounter (Signed)
Rn call patient back. The family member stated Catherine Grant was in the shower. Rn stated Dr. Epimenio FootSater has no available appts.this week. Family member states patient will be seeing her PCP tomorrow. Rn advised family member to tell patient to call back if she has any problems after her appt.

## 2017-06-06 NOTE — Telephone Encounter (Signed)
Pt called the clinic this morning she is having new onset of dizzy episodes for the past 3 weeks. Her PCP has been treating this but could not see her today. She does have an appt with her PCP tomorrow. She is wanting to know if Dr Epimenio FootSater could see her today (new problem). Please call

## 2017-07-09 ENCOUNTER — Ambulatory Visit (INDEPENDENT_AMBULATORY_CARE_PROVIDER_SITE_OTHER): Admitting: Neurology

## 2017-07-09 ENCOUNTER — Encounter: Payer: Self-pay | Admitting: Neurology

## 2017-07-09 ENCOUNTER — Encounter (INDEPENDENT_AMBULATORY_CARE_PROVIDER_SITE_OTHER): Payer: Self-pay

## 2017-07-09 VITALS — BP 138/84 | HR 60 | Resp 16 | Ht 63.0 in | Wt 169.0 lb

## 2017-07-09 DIAGNOSIS — R9089 Other abnormal findings on diagnostic imaging of central nervous system: Secondary | ICD-10-CM

## 2017-07-09 DIAGNOSIS — M21371 Foot drop, right foot: Secondary | ICD-10-CM | POA: Diagnosis not present

## 2017-07-09 DIAGNOSIS — R269 Unspecified abnormalities of gait and mobility: Secondary | ICD-10-CM | POA: Diagnosis not present

## 2017-07-09 DIAGNOSIS — R292 Abnormal reflex: Secondary | ICD-10-CM

## 2017-07-09 MED ORDER — PHENTERMINE HCL 30 MG PO CAPS: 30.0000 mg | ORAL_CAPSULE | ORAL | 5 refills | Status: DC

## 2017-07-09 NOTE — Progress Notes (Signed)
[facility]  PATIENT: Catherine Grant DOB: Aug 04, 1961  REFERRING DOCTOR OR PCP:  Marikay Alar [facility]) SOURCE: patient, records from Dr. Yetta Barre and Dr. Murray Hodgkins, imaging reports, images on PACS. _________________________________   HISTORICAL  CHIEF COMPLAINT:  Chief Complaint  Patient presents with  . Bilateral Leg Weakness    Thinks leg weakness/gait are some better with PT.  Would like to discuss starting a med for wt. loss.  Since last ov, she had a 4 wk. episode of Vertigo, which has completely resolved now/fim    HISTORY OF PRESENT ILLNESS:  Catherine Grant is a 56 yo woman with progressive right > left leg weakness and gait difficulties.     She had repeat MRI of the brain and cervical spine 01/28/2017. The MRI of the brain continues to show the focus in the left cerebellar hemisphere near the fourth ventricle actually worrisome for demyelination there are other scattered T2/FLAIR hyperintense foci in the hemisphere that are nonspecific. There is no atrophy. MRI of the cervical spine showed a normal spinal cord and degenerative changes at C3-C4, C4-C5 and C6-C7 that did not lead to spinal cord compression or nerve root compression.  Thoracic MRI in 2017 also showed a normal spinal cord and mild degenerative changes.   Lumbar puncture 09/25/2016 showed a normal IgG index and no oligoclonal bands.  Lyme Antibodies were negative.  Gait is doing about the same.    She continues to note right leg weakness.  She also noted the right leg shakes and she has dysesthesias in her legs, right > left.    She has done PT with some benefit.   She feels gabapentin and baclofen has helped the mild spasms and the leg pain.    She stumbles some but no falls.    Bladder is doing well.    She had an episode of vertigo lasting a month a cou[ple months ago.  She has gained 20 pounds in the last year.    She feels more fatigue than last year.   She feels attention and  focus is off at times.   She feels frustrated with no diagnosis    She has a cousin with MS.  History:   She first noticed some difficulty with gait and clumsiness of the right leg about a year ago and feels that the symptoms have slowly worsened since then. She last went jogging round the end of 2016 the difficulties. She felt the symptoms were minimal until mid 2017. She denies any numbness. She does have some pain in the leg as well as in the hip.   She denies any weakness numbness or clumsiness in the left leg. She denies any weakness numbness or clumsiness in the arms.    She denies any difficulty with bladder function or bowel function. She has not had any changes in vision.   The MRI of the brain shows a focus in the left middle cerebellar peduncle.   Its appearance and location could be consistent with MS. Elsewhere, she has about a half dozen T2/FLAIR hyperintense foci that are more nonspecific in the hemispheres. The spinal cord did not show any definite lesions and degenerative changes are mild in the cervical and thoracic spine.  Lower Back:  In earlyy 2017, she began to experience back and right leg pain. MRI of the lumbar spine showed spondylolisthesis at L4-L5 associated with disc protrusion. She underwent fusion at L4-L5 by Dr. Yetta Barre in March 2017. She had noticed just a  little bit of weakness in the leg but over the past 4-5 months she is noting more weakness in the leg. Specifically when she walks she will drag the foot some.  On 03/22/2016, she had an EMG/NCV by Dr. Murray Hodgkins. It did not find any evidence of neuropathy or radiculopathy.   She had some pain so she underwent an epidural steroid injection and trochanteric bursa injection but they only helped the pain and not get the symptoms.      REVIEW OF SYSTEMS: Constitutional: No fevers, chills, sweats, or change in appetite.   Sleeps ok Eyes: No visual changes, double vision, eye pain Ear, nose and throat: No hearing loss, ear pain,  nasal congestion, sore throat Cardiovascular: No chest pain, palpitations Respiratory: No shortness of breath at rest or with exertion.   No wheezes GastrointestinaI: No nausea, vomiting, diarrhea, abdominal pain, fecal incontinence Genitourinary: No dysuria, urinary retention or frequency.  No nocturia. Musculoskeletal: as above Integumentary: No rash, pruritus, skin lesions Neurological: as above Psychiatric: No depression at this time.  No anxiety Endocrine: No palpitations, diaphoresis, change in appetite, change in weigh or increased thirst Hematologic/Lymphatic: No anemia, purpura, petechiae. Allergic/Immunologic: No itchy/runny eyes, nasal congestion, recent allergic reactions, rashes  ALLERGIES: Allergies  Allergen Reactions  . Bupropion Rash    HOME MEDICATIONS:  Current Outpatient Prescriptions:  .  baclofen (LIORESAL) 10 MG tablet, Take 2 tablets (20 mg total) by mouth 3 (three) times daily., Disp: 180 each, Rfl: 6 .  FLUoxetine (PROZAC) 20 MG capsule, Take 20 mg by mouth., Disp: , Rfl:  .  losartan (COZAAR) 50 MG tablet, , Disp: , Rfl: 1 .  simvastatin (ZOCOR) 80 MG tablet, Take 40 mg by mouth daily., Disp: , Rfl:  .  gabapentin (NEURONTIN) 300 MG capsule, Take one tablet 3 times daily as needed for leg pain. (Patient not taking: Reported on 07/09/2017), Disp: 90 capsule, Rfl: 5 .  phentermine 30 MG capsule, Take 1 capsule (30 mg total) by mouth every morning., Disp: 30 capsule, Rfl: 5  PAST MEDICAL HISTORY: Past Medical History:  Diagnosis Date  . Hyperlipidemia   . Pneumohemothorax    15 - 20  YRS AGO   CT PLACED  . PONV (postoperative nausea and vomiting)     PAST SURGICAL HISTORY: Past Surgical History:  Procedure Laterality Date  . ABDOMINAL HYSTERECTOMY     partial  . BREAST LUMPECTOMY     RT      1981   . CHEST TUBE INSERTION    . TRANSFORAMINAL LUMBAR INTERBODY FUSION (TLIF) WITH PEDICLE SCREW FIXATION 1 LEVEL N/A 01/26/2016   Procedure: L4-5  TRANSFORAMINAL LUMBAR INTERBODY FUSION (TLIF) WITH PEDICLE SCREW FIXATION ;  Surgeon: Tia Alert, MD;  Location: [facility];  Service: Neurosurgery;  Laterality: N/A;  L4-5 TRANSFORAMINAL LUMBAR INTERBODY FUSION (TLIF) WITH PEDICLE SCREW FIXATION     FAMILY HISTORY: Family History  Problem Relation Age of Onset  . Cancer Father        Lung CA  . Lung cancer Father   . Cancer Sister        Breast CA  . Glaucoma Mother     SOCIAL HISTORY:  Social History   Social History  . Marital status: Married    Spouse name: N/A  . Number of children: N/A  . Years of education: N/A   Occupational History  . Not on file.   Social History Main Topics  . Smoking status: Never Smoker  . Smokeless tobacco:  Never Used  . Alcohol use No  . Drug use: No  . Sexual activity: Not on file   Other Topics Concern  . Not on file   Social History Narrative  . No narrative on file     PHYSICAL EXAM  Vitals:   07/09/17 0823  BP: 138/84  Pulse: 60  Resp: 16  Weight: 169 lb (76.7 kg)  Height: 5\' 3"  (1.6 m)    Body mass index is 29.94 kg/m.   General: The patient is well-developed and well-nourished and in no acute distress   Neurologic Exam  Mental status: The patient is alert and oriented x 3 at the time of the examination. The patient has apparent normal recent and remote memory, with an apparently normal attention span and concentration ability.   Speech is normal.  Cranial nerves: Extraocular movements are full.  There is good facial sensation to soft touch bilaterally.Facial strength is normal.  Trapezius and sternocleidomastoid strength is normal. No dysarthria is noted.  The tongue is midline, and the patient has symmetric elevation of the soft palate. No obvious hearing deficits are noted.  Motor:  Muscle bulk is normal.   Tone is mildly increased in right leg. Strength is  5 / 5 in all 4 extremities.   Sensory: Sensory testing is intact to pinprick, soft touch and  vibration sensation in all 4 extremities.  Coordination: Cerebellar testing reveals good finger-nose-finger and heel-to-shin bilaterally.  Gait and station: Station is normal.   Gait shows right foot drop and she has a reduced stride. Tandem gait is poor. Romberg is negative.   Reflexes: Deep tendon reflexes are symmetric and normal bilaterally in arms and left leg but she has spread at the right knee. No ankle clonus today.        DIAGNOSTIC DATA (LABS, IMAGING, TESTING) - I reviewed patient records, labs, notes, testing and imaging myself where available.  Lab Results  Component Value Date   WBC 4.6 01/19/2016   HGB 12.6 01/19/2016   HCT 40.5 01/19/2016   MCV 95.5 01/19/2016   PLT 241 01/19/2016      Component Value Date/Time   NA 143 01/19/2016 1022   K 4.5 01/19/2016 1022   CL 111 01/19/2016 1022   CO2 23 01/19/2016 1022   GLUCOSE 78 01/19/2016 1022   BUN 14 01/19/2016 1022   CREATININE 1.12 (H) 01/19/2016 1022   CALCIUM 9.6 01/19/2016 1022   GFRNONAA 55 (L) 01/19/2016 1022   GFRAA >60 01/19/2016 1022       ASSESSMENT AND PLAN  Gait disturbance  Abnormal brain MRI  Hyperreflexia  Right foot drop   1.   I had a long discussion with Anjelika and her husband about her abnormal brain MRI and exam.   She has a periventricular focus in the middle cerebellar peduncle and some scattered foci elsewhere the brain.  The spinal cord does not show any abnormal foci.   Initially I was most concerned that she could have primary progressive MS. However, the lumbar puncture was negative making this diagnosis less likely. She had no change in MRI is over 6 months between 10//2017 and 01/2017.  I remain concerned that she has had some progression.   We discussed having her get a second opinion as to where the not she may have MS or another disorder.  2.    Continue baclofen to 20 mg 3 times a day 3.    Remain active, exercise.   Phentermine for weight loss.  4.    I will set up a  second opinion for her at one of the academic institutions.  5.    She will return to see me in 6 months but call sooner if she notes significant worsening of her symptoms during the interim period.     Analuisa Tudor A. Epimenio Foot, MD, PhD 07/09/2017, 12:25 PM Certified in Neurology, Clinical Neurophysiology, Sleep Medicine, Pain Medicine and Neuroimaging  [facility] [address] [place], [place] [address] [phone]

## 2017-08-09 ENCOUNTER — Other Ambulatory Visit: Payer: Self-pay | Admitting: Neurology

## 2017-08-19 ENCOUNTER — Telehealth: Payer: Self-pay | Admitting: Neurology

## 2017-08-19 NOTE — Telephone Encounter (Signed)
Refills available at pharmacy/fim 

## 2017-08-19 NOTE — Telephone Encounter (Signed)
Pt calling for refill of phentermine 30 MG capsule

## 2017-08-20 NOTE — Telephone Encounter (Signed)
error 

## 2017-08-22 NOTE — Telephone Encounter (Signed)
[facility] and they confirmed pt's Phentermine has 4 r/f left.  I spoke with pt's husband and let him know pharmacy does have Phentermine refills/fim

## 2017-08-22 NOTE — Telephone Encounter (Signed)
Patient called and requested to speak with someone regarding medication phentermine. She states that she just hung up with Andrey CampanileSandy RN who told her that there were available refills but after calling her pharmacy they have told her that she has no refills available and will need a written prescription. Please call and advise.

## 2017-09-30 ENCOUNTER — Other Ambulatory Visit: Payer: Self-pay | Admitting: Neurology

## 2017-10-01 ENCOUNTER — Other Ambulatory Visit: Payer: Self-pay | Admitting: Neurology

## 2017-10-23 ENCOUNTER — Telehealth: Payer: Self-pay | Admitting: Neurology

## 2017-10-23 NOTE — Telephone Encounter (Signed)
Error

## 2017-10-31 ENCOUNTER — Telehealth: Payer: Self-pay | Admitting: Neurology

## 2017-10-31 NOTE — Telephone Encounter (Signed)
Pt [facility] but she was advised today they do not have her records. She is requesting a call back  Thank you

## 2017-11-04 NOTE — Telephone Encounter (Signed)
Sent Records.

## 2017-11-26 DIAGNOSIS — Z0289 Encounter for other administrative examinations: Secondary | ICD-10-CM

## 2017-12-23 ENCOUNTER — Other Ambulatory Visit: Payer: Self-pay

## 2017-12-23 ENCOUNTER — Emergency Department (INDEPENDENT_AMBULATORY_CARE_PROVIDER_SITE_OTHER): Admission: EM | Admit: 2017-12-23 | Discharge: 2017-12-23 | Source: Home / Self Care

## 2017-12-23 DIAGNOSIS — R002 Palpitations: Secondary | ICD-10-CM | POA: Diagnosis not present

## 2017-12-23 MED ORDER — ASPIRIN 81 MG PO CHEW
324.0000 mg | CHEWABLE_TABLET | Freq: Once | ORAL | Status: AC
  Administered 2017-12-23: 324 mg via ORAL

## 2017-12-23 NOTE — ED Triage Notes (Signed)
Pt has not felt good today, felt like her BP was up, and she took her BP at home.  It was high and she took her losartan which she has been off of for several months.  She has felt SOB, and like her heart was racing today.  She has also been jittery, and shaky this afternoon.  When asked if she had chest pain today, she stated that she felt a pinch that lasted a second, and maybe happened twice today.

## 2018-01-02 NOTE — ED Provider Notes (Signed)
[facility]    CSN: [id number] Arrival date & time: 12/23/17  1936     History   Chief Complaint Chief Complaint  Patient presents with  . Shortness of Breath  . Shaking    HPI Catherine Grant is a 57 y.o. female.   Patient reports that her heart was "racing" last night without pain or shortness of breath. Her losartan was discontinued several months ago because of hypotensive episodes.  She noted that her BP was increased today so she took a losartan tab.  About 2 hours ago she felt out of breath and noticed that her heart was beating fast.  She had a "pinch" of anterior chest pain that lasted only a second or two.  No chest pain presently.  She feels shaky.   The history is provided by the patient.    Past Medical History:  Diagnosis Date  . Hyperlipidemia   . Pneumohemothorax    15 - 20  YRS AGO   CT PLACED  . PONV (postoperative nausea and vomiting)     Patient Active Problem List   Diagnosis Date Noted  . Low vitamin D level 10/05/2016  . Myelopathy (HCC) 09/07/2016  . Abnormal brain MRI 09/07/2016  . Right foot drop 07/27/2016  . Gait disturbance 07/27/2016  . 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 S/P lumbar spinal fusion 01/26/2016    Past Surgical History:  Procedure Laterality Date  . ABDOMINAL HYSTERECTOMY     partial  . BREAST LUMPECTOMY     RT      1981   . CHEST TUBE INSERTION    . TRANSFORAMINAL LUMBAR INTERBODY FUSION (TLIF) WITH PEDICLE SCREW FIXATION 1 LEVEL N/A 01/26/2016   Procedure: L4-5 TRANSFORAMINAL LUMBAR INTERBODY FUSION (TLIF) WITH PEDICLE SCREW FIXATION ;  Surgeon: Tia Alertavid S Jones, MD;  Location: [facility];  Service: Neurosurgery;  Laterality: N/A;  L4-5 TRANSFORAMINAL LUMBAR INTERBODY FUSION (TLIF) WITH PEDICLE SCREW FIXATION     OB History    No data available       Home Medications    Prior to Admission medications   Medication Sig Start Date End Date Taking? Authorizing Provider  baclofen (LIORESAL) 10 MG tablet TAKE  2 TABLETS BY MOUTH THREE TIMES DAILY 09/30/17   Sater, Pearletha Furlichard A, MD  FLUoxetine (PROZAC) 20 MG capsule Take 20 mg by mouth. 12/31/16   [provider]  gabapentin (NEURONTIN) 300 MG capsule TAKE ONE CAPSULE BY MOUTH THREE TIMES DAILY AS NEEDED FOR LEG PAIN 10/01/17   Sater, Pearletha Furlichard A, MD  losartan (COZAAR) 50 MG tablet  08/23/16   [provider]  phentermine 30 MG capsule Take 1 capsule (30 mg total) by mouth every morning. 07/09/17   Sater, Pearletha Furlichard A, MD  simvastatin (ZOCOR) 80 MG tablet Take 40 mg by mouth daily.    [provider]    Family History Family History  Problem Relation Age of Onset  . Cancer Father        Lung CA  . Lung cancer Father   . Cancer Sister        Breast CA  . Glaucoma Mother     Social History Social History   Tobacco Use  . Smoking status: Never Smoker  . Smokeless tobacco: Never Used  Substance Use Topics  . Alcohol use: No  . Drug use: No     Allergies   Bupropion   Review of Systems Review of Systems  Constitutional: Negative for appetite change, chills, diaphoresis, fatigue  and fever.  HENT: Negative.   Eyes: Negative.   Respiratory: Positive for shortness of breath. Negative for chest tightness and wheezing.   Cardiovascular: Positive for chest pain and palpitations. Negative for leg swelling.  Gastrointestinal: Negative.   Genitourinary: Negative.   Musculoskeletal: Negative.   Skin: Negative.   Neurological:       Has felt shaky and "jittery."     Physical Exam Triage Vital Signs ED Triage Vitals  Enc Vitals Group     BP 12/23/17 2038 (!) 141/93     Pulse Rate 12/23/17 2038 75     Resp --      Temp 12/23/17 2038 98.3 F (36.8 C)     Temp Source 12/23/17 2038 Oral     SpO2 12/23/17 2038 98 %     Weight 12/23/17 2039 165 lb (74.8 kg)     Height 12/23/17 2039 5\' 4"  (1.626 m)     Head Circumference --      Peak Flow --      Pain Score 12/23/17 2039 0     Pain Loc --      Pain Edu? --       Excl. in GC? --    No data found.  Updated Vital Signs BP (!) 141/93 (BP Location: Right Arm)   Pulse 75   Temp 98.3 F (36.8 C) (Oral)   Ht 5\' 4"  (1.626 m)   Wt 165 lb (74.8 kg)   SpO2 98%   BMI 28.32 kg/m   Visual Acuity Right Eye Distance:   Left Eye Distance:   Bilateral Distance:    Right Eye Near:   Left Eye Near:    Bilateral Near:     Physical Exam Nursing notes and Vital Signs reviewed. Appearance:  Patient appears stated age, and in no acute distress.  She is alert and oriented.   Eyes:  Pupils are equal, round, and reactive to light and accomodation.  Extraocular movement is intact.  Conjunctivae are not inflamed   Pharynx:  Normal; moist mucous membranes  Neck:  Supple.  No adenopathy Lungs:  Clear to auscultation.  Breath sounds are equal.  Moving air well. Heart:  Regular rate and rhythm without murmurs, rubs, or gallops.  Abdomen:  Nontender without masses or hepatosplenomegaly.  Bowel sounds are present.  No CVA or flank tenderness.  Extremities:  No edema.  No lower leg tenderness to palpation. Skin:  Warm and dry; no rash.    UC Treatments / Results  Labs (all labs ordered are listed, but only abnormal results are displayed) Labs Reviewed - No data to display  EKG  EKG Interpretation  Rate:  77 BPM PR:  150 msec QT:  382 msec QTcH:  432 msec QRSD:  82 msec QRS axis:  -22 degrees Interpretation:   Normal sinus rhythm.  Note subtle T wave inversions in leads I and aVL not previously present on EKG done 21 January 2016       Radiology No results found.  Procedures Procedures (including critical care time)  Medications Ordered in UC Medications  aspirin chewable tablet 324 mg (324 mg Oral Given 12/23/17 2048)     Initial Impression / Assessment and Plan / UC Course  I have reviewed the triage vital signs and the nursing notes.  Pertinent labs & imaging results that were available during my care of the patient were reviewed by me and  considered in my medical decision making (see chart for details).  Subtle ST changes on today's EKG not present on EKG done 19 January 2016. Will transfer to [facility] ED by EMS for further evaluation.  Patient's vital signs stable.      Final Clinical Impressions(s) / UC Diagnoses   Final diagnoses:  Palpitations    ED Discharge Orders    None        Lattie Haw, MD 01/02/18 [phone]

## 2018-01-06 ENCOUNTER — Encounter: Payer: Self-pay | Admitting: Neurology

## 2018-01-06 ENCOUNTER — Ambulatory Visit (INDEPENDENT_AMBULATORY_CARE_PROVIDER_SITE_OTHER): Admitting: Neurology

## 2018-01-06 ENCOUNTER — Telehealth: Payer: Self-pay | Admitting: Neurology

## 2018-01-06 ENCOUNTER — Encounter (INDEPENDENT_AMBULATORY_CARE_PROVIDER_SITE_OTHER): Payer: Self-pay

## 2018-01-06 ENCOUNTER — Other Ambulatory Visit: Payer: Self-pay

## 2018-01-06 VITALS — BP 126/85 | HR 86 | Resp 16 | Ht 64.0 in | Wt 163.5 lb

## 2018-01-06 DIAGNOSIS — M6281 Muscle weakness (generalized): Secondary | ICD-10-CM | POA: Diagnosis not present

## 2018-01-06 DIAGNOSIS — R269 Unspecified abnormalities of gait and mobility: Secondary | ICD-10-CM

## 2018-01-06 DIAGNOSIS — R9089 Other abnormal findings on diagnostic imaging of central nervous system: Secondary | ICD-10-CM | POA: Diagnosis not present

## 2018-01-06 DIAGNOSIS — G959 Disease of spinal cord, unspecified: Secondary | ICD-10-CM | POA: Diagnosis not present

## 2018-01-06 DIAGNOSIS — M21371 Foot drop, right foot: Secondary | ICD-10-CM

## 2018-01-06 NOTE — Telephone Encounter (Signed)
Patient has [employer] sent order to [facility]. They will reach out to the patient to schedule.

## 2018-01-06 NOTE — Progress Notes (Signed)
[facility]  PATIENT: Catherine Grant DOB: [birth year]  REFERRING DOCTOR OR PCP:  [facility]) SOURCE: patient, records from Dr. Yetta Barre and Dr. Murray Hodgkins, imaging reports, images on PACS. _________________________________   HISTORICAL  CHIEF COMPLAINT:  Chief Complaint  Patient presents with  . Abnormal MRI Brain    Saw Dr. Renne Crigler at [facility] for 2nd opinion, sts. Dr. Renne Crigler did not believe abnormal MRI brain was conclusive for an MS dx, and rec. rechecking MRI in one yr  She stopped Prozac and Phentermine as she did not believe they were effective for depression, wt. loss/fim  . Bilateral Leg Weakness  . Right Foot Drop    HISTORY OF PRESENT ILLNESS:  Catherine Grant is a 57 yo woman with progressive right > left leg weakness and gait difficulties.     Update 01/06/2018: Since the last visit, she has seen the MS center at [facility] (Dr. Renne Crigler).   She agrees that her presentation is potentially worrisome for MS but that there is not enough evidence to diagnose MS at this time. She recommends a follow-up MRI in a year.  She feels about the same as the last visit.    She notes the right foot drop.   Her left leg is sometimes sore.  She did some PT and felt slightly better but not much different,    Vision is doing well.   Bladder is fine.   No more episodes of vertigo.      She notes some depression.   She has gained about 20 pounds.   Phentermine had not helped the weight loss.     Baclofen 20 mg po tid has not helped.   From 07/26/17: She had repeat MRI of the brain and cervical spine 01/28/2017. The MRI of the brain continues to show the focus in the left cerebellar hemisphere near the fourth ventricle actually worrisome for demyelination there are other scattered T2/FLAIR hyperintense foci in the hemisphere that are nonspecific. There is no atrophy. MRI of the cervical spine showed a normal spinal cord and degenerative changes at C3-C4, C4-C5 and  C6-C7 that did not lead to spinal cord compression or nerve root compression.  Thoracic MRI in 2017 also showed a normal spinal cord and mild degenerative changes.   Lumbar puncture 09/25/2016 showed a normal IgG index and no oligoclonal bands.  Lyme Antibodies were negative.  Gait is doing about the same.    She continues to note right leg weakness.  She also noted the right leg shakes and she has dysesthesias in her legs, right > left.    She has done PT with some benefit.   She feels gabapentin and baclofen has helped the mild spasms and the leg pain.    She stumbles some but no falls.    Bladder is doing well.    She had an episode of vertigo lasting a month a cou[ple months ago.  She has gained 20 pounds in the last year.    She feels more fatigue than last year.   She feels attention and focus is off at times.   She feels frustrated with no diagnosis    She has a cousin with MS.  History:   She first noticed some difficulty with gait and clumsiness of the right leg about a year ago and feels that the symptoms have slowly worsened since then. She last went jogging round the end of 2016 the difficulties. She felt the symptoms were minimal until  mid 2017. She denies any numbness. She does have some pain in the leg as well as in the hip.   She denies any weakness numbness or clumsiness in the left leg. She denies any weakness numbness or clumsiness in the arms.    She denies any difficulty with bladder function or bowel function. She has not had any changes in vision.   The MRI of the brain shows a focus in the left middle cerebellar peduncle.   Its appearance and location could be consistent with MS. Elsewhere, she has about a half dozen T2/FLAIR hyperintense foci that are more nonspecific in the hemispheres. The spinal cord did not show any definite lesions and degenerative changes are mild in the cervical and thoracic spine.  Lower Back:  In earlyy 2017, she began to experience back and right leg  pain. MRI of the lumbar spine showed spondylolisthesis at L4-L5 associated with disc protrusion. She underwent fusion at L4-L5 by Dr. Yetta Barre in March 2017. She had noticed just a little bit of weakness in the leg but over the past 4-5 months she is noting more weakness in the leg. Specifically when she walks she will drag the foot some.  On 03/22/2016, she had an EMG/NCV by Dr. Murray Hodgkins. It did not find any evidence of neuropathy or radiculopathy.   She had some pain so she underwent an epidural steroid injection and trochanteric bursa injection but they only helped the pain and not get the symptoms.      REVIEW OF SYSTEMS: Constitutional: No fevers, chills, sweats, or change in appetite.   Sleeps ok Eyes: No visual changes, double vision, eye pain Ear, nose and throat: No hearing loss, ear pain, nasal congestion, sore throat Cardiovascular: No chest pain, palpitations Respiratory: No shortness of breath at rest or with exertion.   No wheezes GastrointestinaI: No nausea, vomiting, diarrhea, abdominal pain, fecal incontinence Genitourinary: No dysuria, urinary retention or frequency.  No nocturia. Musculoskeletal: as above Integumentary: No rash, pruritus, skin lesions Neurological: as above Psychiatric: No depression at this time.  No anxiety Endocrine: No palpitations, diaphoresis, change in appetite, change in weigh or increased thirst Hematologic/Lymphatic: No anemia, purpura, petechiae. Allergic/Immunologic: No itchy/runny eyes, nasal congestion, recent allergic reactions, rashes  ALLERGIES: Allergies  Allergen Reactions  . Bupropion Rash    HOME MEDICATIONS:  Current Outpatient Medications:  .  baclofen (LIORESAL) 10 MG tablet, TAKE 2 TABLETS BY MOUTH THREE TIMES DAILY, Disp: 180 tablet, Rfl: 3 .  gabapentin (NEURONTIN) 300 MG capsule, TAKE ONE CAPSULE BY MOUTH THREE TIMES DAILY AS NEEDED FOR LEG PAIN, Disp: 90 capsule, Rfl: 5 .  simvastatin (ZOCOR) 80 MG tablet, Take 40 mg by  mouth daily., Disp: , Rfl:  .  FLUoxetine (PROZAC) 20 MG capsule, Take 20 mg by mouth., Disp: , Rfl:  .  losartan (COZAAR) 50 MG tablet, , Disp: , Rfl: 1 .  phentermine 30 MG capsule, Take 1 capsule (30 mg total) by mouth every morning., Disp: 30 capsule, Rfl: 5  PAST MEDICAL HISTORY: Past Medical History:  Diagnosis Date  . Hyperlipidemia   . Pneumohemothorax    15 - 20  YRS AGO   CT PLACED  . PONV (postoperative nausea and vomiting)     PAST SURGICAL HISTORY: Past Surgical History:  Procedure Laterality Date  . ABDOMINAL HYSTERECTOMY     partial  . BREAST LUMPECTOMY     RT      1981   . CHEST TUBE INSERTION    . TRANSFORAMINAL  LUMBAR INTERBODY FUSION (TLIF) WITH PEDICLE SCREW FIXATION 1 LEVEL N/A 01/26/2016   Procedure: L4-5 TRANSFORAMINAL LUMBAR INTERBODY FUSION (TLIF) WITH PEDICLE SCREW FIXATION ;  Surgeon: Tia Alert, MD;  Location: [facility];  Service: Neurosurgery;  Laterality: N/A;  L4-5 TRANSFORAMINAL LUMBAR INTERBODY FUSION (TLIF) WITH PEDICLE SCREW FIXATION     FAMILY HISTORY: Family History  Problem Relation Age of Onset  . Cancer Father        Lung CA  . Lung cancer Father   . Cancer Sister        Breast CA  . Glaucoma Mother     SOCIAL HISTORY:  Social History   Socioeconomic History  . Marital status: Married    Spouse name: Not on file  . Number of children: Not on file  . Years of education: Not on file  . Highest education level: Not on file  Social Needs  . Financial resource strain: Not on file  . Food insecurity - worry: Not on file  . Food insecurity - inability: Not on file  . Transportation needs - medical: Not on file  . Transportation needs - non-medical: Not on file  Occupational History  . Not on file  Tobacco Use  . Smoking status: Never Smoker  . Smokeless tobacco: Never Used  Substance and Sexual Activity  . Alcohol use: No  . Drug use: No  . Sexual activity: Not on file  Other Topics Concern  . Not on file  Social  History Narrative  . Not on file     PHYSICAL EXAM  Vitals:   01/06/18 0825  BP: 126/85  Pulse: 86  Resp: 16  Weight: 163 lb 8 oz (74.2 kg)  Height: 5\' 4"  (1.626 m)    Body mass index is 28.06 kg/m.   General: The patient is well-developed and well-nourished and in no acute distress   Neurologic Exam  Mental status: The patient is alert and oriented x 3 at the time of the examination. The patient has apparent normal recent and remote memory, with an apparently normal attention span and concentration ability.   Speech is normal.  Cranial nerves: Extraocular movements are full.  Facial strength and sensation is normal. Trapezius strength is normal. No dysarthria is noted.  The tongue is midline, and the patient has symmetric elevation of the soft palate. No obvious hearing deficits are noted.  Motor:  She has mild right leg tremor.  Muscle bulk is normal.   Tone is mildly increased in right leg. Strength is  5 / 5 in all 4 extremities.   Sensory: Sensory testing is intact to pinprick, soft touch and vibration sensation in all 4 extremities.  Coordination: Cerebellar testing reveals good finger-nose-finger and heel-to-shin bilaterally.  Gait and station: Station is normal.   Gait shows right foot drop and she has a reduced stride. The tandem gait is poor.  Romberg is negative.   Reflexes: Deep tendon reflexes are symmetric and normal bilaterally in arms and left leg but she has spread at the right knee. No ankle clonus today.        DIAGNOSTIC DATA (LABS, IMAGING, TESTING) - I reviewed patient records, labs, notes, testing and imaging myself where available.  Lab Results  Component Value Date   WBC 4.6 01/19/2016   HGB 12.6 01/19/2016   HCT 40.5 01/19/2016   MCV 95.5 01/19/2016   PLT 241 01/19/2016      Component Value Date/Time   NA 143 01/19/2016 1022  K 4.5 01/19/2016 1022   CL 111 01/19/2016 1022   CO2 23 01/19/2016 1022   GLUCOSE 78 01/19/2016 1022   BUN  14 01/19/2016 1022   CREATININE 1.12 (H) 01/19/2016 1022   CALCIUM 9.6 01/19/2016 1022   GFRNONAA 55 (L) 01/19/2016 1022   GFRAA >60 01/19/2016 1022       ASSESSMENT AND PLAN  Right foot drop  Disease of spinal cord (HCC) - Plan: MR CERVICAL SPINE W WO CONTRAST  Muscle weakness (generalized) - Plan: MR BRAIN W WO CONTRAST  Gait disturbance  Myelopathy (HCC)  Abnormal brain MRI   1.   I discussed with Nicholette that we don't have a great explanation for her gait disorder and foot drop. Her last MRI of the brain showed a focus in the middle cerebellar peduncle potentially worrisome for MS but her CSF was normal, reducing the chance of that disorder.   [facility] regarding her leg tremor 2.   Reduce baclofen to 10 mg - 10 mg - 20 mg   3.   Remain active, exercise.   4.   Check MRI of the brain and cervical spine to determine if there has been any more progression. 5.   She will return to see me in 6 months but call sooner if she notes significant worsening of her symptoms during the interim period.     Richard A. Epimenio FootSater, MD, PhD 01/06/2018, 3:18 PM Certified in Neurology, Clinical Neurophysiology, Sleep Medicine, Pain Medicine and Neuroimaging  [facility] [address], [address] [place], [place] [address] [phone]

## 2018-02-04 ENCOUNTER — Telehealth: Payer: Self-pay | Admitting: Neurology

## 2018-02-04 MED ORDER — ALPRAZOLAM 0.5 MG PO TABS: ORAL_TABLET | ORAL | 0 refills | Status: DC

## 2018-02-04 NOTE — Telephone Encounter (Signed)
RAS sig/fim 

## 2018-02-04 NOTE — Telephone Encounter (Signed)
Pt is asking for something to be called in for her nerves before she has her MRI please use [facility] [id number] - [place], [place] - [address] AT [facility] OF [address] [phone] (Phone) [phone] (Fax)

## 2018-02-04 NOTE — Addendum Note (Signed)
Addended by: Candis SchatzMISENHEIMER, Quame Spratlin I on: 02/04/2018 10:45 AM   Modules accepted: Orders

## 2018-02-04 NOTE — Telephone Encounter (Signed)
Alprazolam rx. faxed to [facility]/fim

## 2018-02-17 ENCOUNTER — Other Ambulatory Visit

## 2018-02-20 ENCOUNTER — Ambulatory Visit
Admission: RE | Admit: 2018-02-20 | Discharge: 2018-02-20 | Disposition: A | Discharge status: Home / Self Care | Source: Ambulatory Visit | Attending: Neurology | Admitting: Neurology

## 2018-02-20 ENCOUNTER — Other Ambulatory Visit: Payer: Self-pay | Admitting: Neurology

## 2018-02-20 DIAGNOSIS — M6281 Muscle weakness (generalized): Secondary | ICD-10-CM | POA: Diagnosis not present

## 2018-02-20 DIAGNOSIS — G959 Disease of spinal cord, unspecified: Secondary | ICD-10-CM

## 2018-02-20 MED ORDER — GADOBENATE DIMEGLUMINE 529 MG/ML IV SOLN
15.0000 mL | Freq: Once | INTRAVENOUS | Status: AC | PRN
  Administered 2018-02-20: 15 mL via INTRAVENOUS

## 2018-02-24 ENCOUNTER — Telehealth: Payer: Self-pay | Admitting: *Deleted

## 2018-02-24 NOTE — Telephone Encounter (Signed)
Spoke with Luella and reviewed below MRI report.  She verbalized understanding of same/fim

## 2018-02-24 NOTE — Telephone Encounter (Signed)
-----   Message from Asa Lente, MD sent at 02/24/2018  4:09 PM EDT ----- Please let her know that the MRI of the brain did not show any new lesions.    MRI of the spine shows that the spinal cord looked normal.  .  She does have some degenerative changes at C4-C5 that looks about the same as the previous cervical spine MRI

## 2018-03-08 ENCOUNTER — Other Ambulatory Visit: Payer: Self-pay | Admitting: Neurology

## 2018-04-06 ENCOUNTER — Other Ambulatory Visit: Payer: Self-pay | Admitting: Neurology

## 2018-04-07 ENCOUNTER — Other Ambulatory Visit: Payer: Self-pay | Admitting: Neurology

## 2018-05-09 ENCOUNTER — Other Ambulatory Visit: Payer: Self-pay | Admitting: Neurology

## 2018-05-27 DIAGNOSIS — Z0289 Encounter for other administrative examinations: Secondary | ICD-10-CM

## 2018-06-08 ENCOUNTER — Other Ambulatory Visit: Payer: Self-pay | Admitting: Neurology

## 2018-07-09 ENCOUNTER — Ambulatory Visit: Admitting: Neurology

## 2018-07-10 ENCOUNTER — Telehealth: Payer: Self-pay | Admitting: *Deleted

## 2018-07-10 ENCOUNTER — Other Ambulatory Visit: Payer: Self-pay | Admitting: Neurology

## 2018-07-10 MED ORDER — GABAPENTIN 300 MG PO CAPS: 300.0000 mg | ORAL_CAPSULE | Freq: Three times a day (TID) | ORAL | 0 refills | Status: DC | PRN

## 2018-07-10 NOTE — Telephone Encounter (Signed)
Gabapentin escribed to [facility] in response to faxed request from them/fim

## 2018-07-16 ENCOUNTER — Other Ambulatory Visit: Payer: Self-pay | Admitting: Orthopedic Surgery

## 2018-07-16 DIAGNOSIS — M25551 Pain in right hip: Secondary | ICD-10-CM

## 2018-07-30 ENCOUNTER — Ambulatory Visit
Admission: RE | Admit: 2018-07-30 | Discharge: 2018-07-30 | Disposition: A | Discharge status: Home / Self Care | Source: Ambulatory Visit | Attending: Orthopedic Surgery | Admitting: Orthopedic Surgery

## 2018-07-30 DIAGNOSIS — M25551 Pain in right hip: Secondary | ICD-10-CM

## 2018-07-30 MED ORDER — METHYLPREDNISOLONE ACETATE 40 MG/ML INJ SUSP (RADIOLOG
120.0000 mg | Freq: Once | INTRAMUSCULAR | Status: AC
  Administered 2018-07-30: 120 mg via INTRAMUSCULAR

## 2018-07-30 MED ORDER — IOPAMIDOL (ISOVUE-M 200) INJECTION 41%
1.0000 mL | Freq: Once | INTRAMUSCULAR | Status: AC
  Administered 2018-07-30: 1 mL

## 2018-07-30 NOTE — Discharge Instructions (Signed)

## 2018-08-25 ENCOUNTER — Telehealth: Payer: Self-pay | Admitting: *Deleted

## 2018-08-25 ENCOUNTER — Encounter: Payer: Self-pay | Admitting: Neurology

## 2018-08-25 ENCOUNTER — Ambulatory Visit (INDEPENDENT_AMBULATORY_CARE_PROVIDER_SITE_OTHER): Admitting: Neurology

## 2018-08-25 VITALS — BP 120/84 | HR 54 | Ht 64.0 in | Wt 169.5 lb

## 2018-08-25 DIAGNOSIS — G248 Other dystonia: Secondary | ICD-10-CM

## 2018-08-25 DIAGNOSIS — R251 Tremor, unspecified: Secondary | ICD-10-CM | POA: Diagnosis not present

## 2018-08-25 DIAGNOSIS — R269 Unspecified abnormalities of gait and mobility: Secondary | ICD-10-CM

## 2018-08-25 DIAGNOSIS — R9089 Other abnormal findings on diagnostic imaging of central nervous system: Secondary | ICD-10-CM

## 2018-08-25 MED ORDER — PHENTERMINE HCL 30 MG PO CAPS: 30.0000 mg | ORAL_CAPSULE | ORAL | 5 refills | Status: DC

## 2018-08-25 MED ORDER — PROPRANOLOL HCL ER 80 MG PO CP24: 80.0000 mg | ORAL_CAPSULE | Freq: Every day | ORAL | 5 refills | Status: AC

## 2018-08-25 NOTE — Telephone Encounter (Signed)
PA submitted. Waiting on determination.   "[facility] is reviewing your PA request and will respond within 24 hours for Medicaid or up to 72 hours for non-Medicaid plans, based on the required timeframe determined by state or federal regulations".

## 2018-08-25 NOTE — Progress Notes (Signed)
[facility]  PATIENT: Catherine Grant DOB: 1961/01/28  REFERRING DOCTOR OR PCP:  Marikay Alar [facility] Neurosurgery) SOURCE: patient, records from Dr. Yetta Barre and Dr. Murray Hodgkins, imaging reports, images on PACS. _________________________________   HISTORICAL  CHIEF COMPLAINT:  Chief Complaint  Patient presents with  . Follow-up    RM 12 with Cesear. Last seen 01/06/18. No falls since last seen. No vision changes. No new sx. Had hip inj 07-30-18. Helped with pain but not her walking.     HISTORY OF PRESENT ILLNESS:  Catherine Grant is a 57 y.o. woman with progressive right > left leg weakness and gait difficulties.     Update 08/25/2018: She has continued right leg symptoms (spasms and tremors) occurring when she stands or walks.   The right toes curl up and she has trouble walking.      She feels symptoms are the same as earlier this year.      She is on baclofen 20-10-20 mg and it helps her spasticity but not the tremoring.   She has no new symptoms.     MRIs have been stable with one left cerebellar focus potentially worrisome for MS but no other lesions (few punctate hemispheric foci, normal for age) She saw Dr. Arlana Pouch at the movement disorder clinic at [facility] and received Botox injections in the right leg x 2.   She did not feel that there was any benefit.       No new symptoms.   Bladder and vision are fine.   She was having some hip  I personally reviewed the MRI of the brain and cervical spine from 02/21/2018.  The MRI of the brain continues to show a T2/FLAIR hyperintense focus in the left cerebellar hemisphere adjacent to the fourth ventricle.  There are a couple punctate T2/FLAIR hypertense foci elsewhere that are nonspecific.  There are no new lesions compared to an MRI from 2018.  The spinal cord shows a normal signal and minimal degenerative changes.   Lumbar puncture showed normal CSF 2017.  Update 01/06/2018: Since the last visit, she has seen the MS  center [facility] (Dr. Renne Crigler).   She agrees that her presentation is potentially worrisome for MS but that there is not enough evidence to diagnose MS at this time. She recommends a follow-up MRI in a year.  She feels about the same as the last visit.    She notes the right foot drop.   Her left leg is sometimes sore.  She did some PT and felt slightly better but not much different,    Vision is doing well.   Bladder is fine.   No more episodes of vertigo.      She notes some depression.   She has gained about 20 pounds.   Phentermine had not helped the weight loss.     Baclofen 20 mg po tid has not helped.   From 07/26/17: She had repeat MRI of the brain and cervical spine 01/28/2017. The MRI of the brain continues to show the focus in the left cerebellar hemisphere near the fourth ventricle actually worrisome for demyelination there are other scattered T2/FLAIR hyperintense foci in the hemisphere that are nonspecific. There is no atrophy. MRI of the cervical spine showed a normal spinal cord and degenerative changes at C3-C4, C4-C5 and C6-C7 that did not lead to spinal cord compression or nerve root compression.  Thoracic MRI in 2017 also showed a normal spinal cord and mild degenerative changes.  Lumbar puncture 09/25/2016 showed a normal IgG index and no oligoclonal bands.  Lyme Antibodies were negative.  Gait is doing about the same.    She continues to note right leg weakness.  She also noted the right leg shakes and she has dysesthesias in her legs, right > left.    She has done PT with some benefit.   She feels gabapentin and baclofen has helped the mild spasms and the leg pain.    She stumbles some but no falls.    Bladder is doing well.    She had an episode of vertigo lasting a month a cou[ple months ago.  She has gained 20 pounds in the last year.    She feels more fatigue than last year.   She feels attention and focus is off at times.   She feels frustrated with no diagnosis    She  has a cousin with MS.  History:   She first noticed some difficulty with gait and clumsiness of the right leg about a year ago and feels that the symptoms have slowly worsened since then. She last went jogging round the end of 2016 the difficulties. She felt the symptoms were minimal until mid 2017. She denies any numbness. She does have some pain in the leg as well as in the hip.   She denies any weakness numbness or clumsiness in the left leg. She denies any weakness numbness or clumsiness in the arms.    She denies any difficulty with bladder function or bowel function. She has not had any changes in vision.   The MRI of the brain shows a focus in the left middle cerebellar peduncle.   Its appearance and location could be consistent with MS. Elsewhere, she has about a half dozen T2/FLAIR hyperintense foci that are more nonspecific in the hemispheres. The spinal cord did not show any definite lesions and degenerative changes are mild in the cervical and thoracic spine.  Lower Back:  In earlyy 2017, she began to experience back and right leg pain. MRI of the lumbar spine showed spondylolisthesis at L4-L5 associated with disc protrusion. She underwent fusion at L4-L5 by Dr. Yetta Barre in March 2017. She had noticed just a little bit of weakness in the leg but over the past 4-5 months she is noting more weakness in the leg. Specifically when she walks she will drag the foot some.  On 03/22/2016, she had an EMG/NCV by Dr. Murray Hodgkins. It did not find any evidence of neuropathy or radiculopathy.   She had some pain so she underwent an epidural steroid injection and trochanteric bursa injection but they only helped the pain and not get the symptoms.      REVIEW OF SYSTEMS: Constitutional: No fevers, chills, sweats, or change in appetite.   Sleeps ok Eyes: No visual changes, double vision, eye pain Ear, nose and throat: No hearing loss, ear pain, nasal congestion, sore throat Cardiovascular: No chest pain,  palpitations Respiratory: No shortness of breath at rest or with exertion.   No wheezes GastrointestinaI: No nausea, vomiting, diarrhea, abdominal pain, fecal incontinence Genitourinary: No dysuria, urinary retention or frequency.  No nocturia. Musculoskeletal: as above Integumentary: No rash, pruritus, skin lesions Neurological: as above Psychiatric: No depression at this time.  No anxiety Endocrine: No palpitations, diaphoresis, change in appetite, change in weigh or increased thirst Hematologic/Lymphatic: No anemia, purpura, petechiae. Allergic/Immunologic: No itchy/runny eyes, nasal congestion, recent allergic reactions, rashes  ALLERGIES: Allergies  Allergen Reactions  . Bupropion Rash  HOME MEDICATIONS:  Current Outpatient Medications:  .  baclofen (LIORESAL) 10 MG tablet, Take one tablet in am, one tablet midday, two tablets in pm., Disp: 120 tablet, Rfl: 05 .  gabapentin (NEURONTIN) 300 MG capsule, Take 1 capsule (300 mg total) by mouth 3 (three) times daily as needed., Disp: 90 capsule, Rfl: 0 .  phentermine 30 MG capsule, Take 1 capsule (30 mg total) by mouth every morning., Disp: 30 capsule, Rfl: 5 .  simvastatin (ZOCOR) 80 MG tablet, Take 40 mg by mouth daily., Disp: , Rfl:  .  propranolol ER (INDERAL LA) 80 MG 24 hr capsule, Take 1 capsule (80 mg total) by mouth daily., Disp: 30 capsule, Rfl: 5  PAST MEDICAL HISTORY: Past Medical History:  Diagnosis Date  . Hyperlipidemia   . Pneumohemothorax    15 - 20  YRS AGO   CT PLACED  . PONV (postoperative nausea and vomiting)     PAST SURGICAL HISTORY: Past Surgical History:  Procedure Laterality Date  . ABDOMINAL HYSTERECTOMY     partial  . BREAST LUMPECTOMY     RT      1981   . CHEST TUBE INSERTION    . TRANSFORAMINAL LUMBAR INTERBODY FUSION (TLIF) WITH PEDICLE SCREW FIXATION 1 LEVEL N/A 01/26/2016   Procedure: L4-5 TRANSFORAMINAL LUMBAR INTERBODY FUSION (TLIF) WITH PEDICLE SCREW FIXATION ;  Surgeon: Tia Alert, MD;  Location: [facility];  Service: Neurosurgery;  Laterality: N/A;  L4-5 TRANSFORAMINAL LUMBAR INTERBODY FUSION (TLIF) WITH PEDICLE SCREW FIXATION     FAMILY HISTORY: Family History  Problem Relation Age of Onset  . Cancer Father        Lung CA  . Lung cancer Father   . Cancer Sister        Breast CA  . Glaucoma Mother     SOCIAL HISTORY:  Social History   Socioeconomic History  . Marital status: Married    Spouse name: Not on file  . Number of children: Not on file  . Years of education: Not on file  . Highest education level: Not on file  Occupational History  . Not on file  Social Needs  . Financial resource strain: Not on file  . Food insecurity:    Worry: Not on file    Inability: Not on file  . Transportation needs:    Medical: Not on file    Non-medical: Not on file  Tobacco Use  . Smoking status: Never Smoker  . Smokeless tobacco: Never Used  Substance and Sexual Activity  . Alcohol use: No  . Drug use: No  . Sexual activity: Not on file  Lifestyle  . Physical activity:    Days per week: Not on file    Minutes per session: Not on file  . Stress: Not on file  Relationships  . Social connections:    Talks on phone: Not on file    Gets together: Not on file    Attends religious service: Not on file    Active member of club or organization: Not on file    Attends meetings of clubs or organizations: Not on file    Relationship status: Not on file  . Intimate partner violence:    Fear of current or ex partner: Not on file    Emotionally abused: Not on file    Physically abused: Not on file    Forced sexual activity: Not on file  Other Topics Concern  . Not on file  Social  History Narrative  . Not on file     PHYSICAL EXAM  Vitals:   08/25/18 1113  BP: 120/84  Pulse: (!) 54  Weight: 169 lb 8 oz (76.9 kg)  Height: 5\' 4"  (1.626 m)    Body mass index is 29.09 kg/m.   General: The patient is well-developed and well-nourished and  in no acute distress   Neurologic Exam  Mental status: The patient is alert and oriented x 3 at the time of the examination. The patient has apparent normal recent and remote memory, with an apparently normal attention span and concentration ability.   Speech is normal.  Cranial nerves: Extraocular movements are full.  Facial strength and sensation was normal.  Trapezius strength was normal.  The tongue is midline, and the patient has symmetric elevation of the soft palate. No obvious hearing deficits are noted.  Motor:  She has mild right leg tremor.  Muscle bulk is normal.   Tone is mildly increased in right leg. Strength is  5 / 5 in all 4 extremities.   Sensory: Sensory testing is intact to pinprick, soft touch and vibration sensation in arms and slight vibration asymmetry in feet, reduced on the left relative to the right  Coordination: Cerebellar testing shows good finger nose finger and heel to shin.    Gait and station: Station is normal.   Gait shows a slight right foot drop and she has a reduced stride. The tandem gait is wide.  Romberg is negative  Reflexes: Deep tendon reflexes are symmetric and normal bilaterally in arms and left leg but she has spread at the right knee. No ankle clonus     DIAGNOSTIC DATA (LABS, IMAGING, TESTING) - I reviewed patient records, labs, notes, testing and imaging myself where available.  Lab Results  Component Value Date   WBC 4.6 01/19/2016   HGB 12.6 01/19/2016   HCT 40.5 01/19/2016   MCV 95.5 01/19/2016   PLT 241 01/19/2016      Component Value Date/Time   NA 143 01/19/2016 1022   K 4.5 01/19/2016 1022   CL 111 01/19/2016 1022   CO2 23 01/19/2016 1022   GLUCOSE 78 01/19/2016 1022   BUN 14 01/19/2016 1022   CREATININE 1.12 (H) 01/19/2016 1022   CALCIUM 9.6 01/19/2016 1022   GFRNONAA 55 (L) 01/19/2016 1022   GFRAA >60 01/19/2016 1022       ASSESSMENT AND PLAN  Abnormal brain MRI  Gait disturbance  Focal  dystonia  Tremor   1.  Etiology of symptoms unclear.   Unclear if cerebellar lesion is playing any role in her right foot dystonic symptoms but unlikely.  With stable MRI's x 2 years, MS is unlikely.    She did not benefit from Botox.    Trial of Inderal LA for tremor in right leg.  2.   Continue baclofen to 20 mg - 10 mg - 20 mg   3.   Trial of phentermine for weight loss 4.   She will return to see me as needed if she notes significant worsening of her symptoms or new symptoms.     Sherrod Toothman A. Epimenio Foot, MD, PhD 08/25/2018, 11:58 AM Certified in Neurology, Clinical Neurophysiology, Sleep Medicine, Pain Medicine and Neuroimaging  [facility] [address] [place], [place] [address] [phone]

## 2018-08-25 NOTE — Telephone Encounter (Signed)
Initiated PA phentermine on covermymeds. Key: [id number] - PA Case ID: [id number] - Rx #: [id number]. In process of completing.

## 2018-08-28 NOTE — Telephone Encounter (Signed)
Received fax notification from [employer] [facility] ([employer]are pharmacy program) that PA approved until 11/26/18.   Faxed notice of approval to [facility] at [phone]. Received fax confirmation.

## 2018-09-27 ENCOUNTER — Other Ambulatory Visit: Payer: Self-pay | Admitting: Neurology

## 2018-11-12 IMAGING — XA DG FLUORO GUIDE NDL PLC/BX
1 series · 1 of 1 positions shown · non-contrast
Comparison: none

CLINICAL DATA: Suspected iliopsoas bursitis.

[Series 1: ortho standard · 1 of 1 slices shown]
[im 1/1]
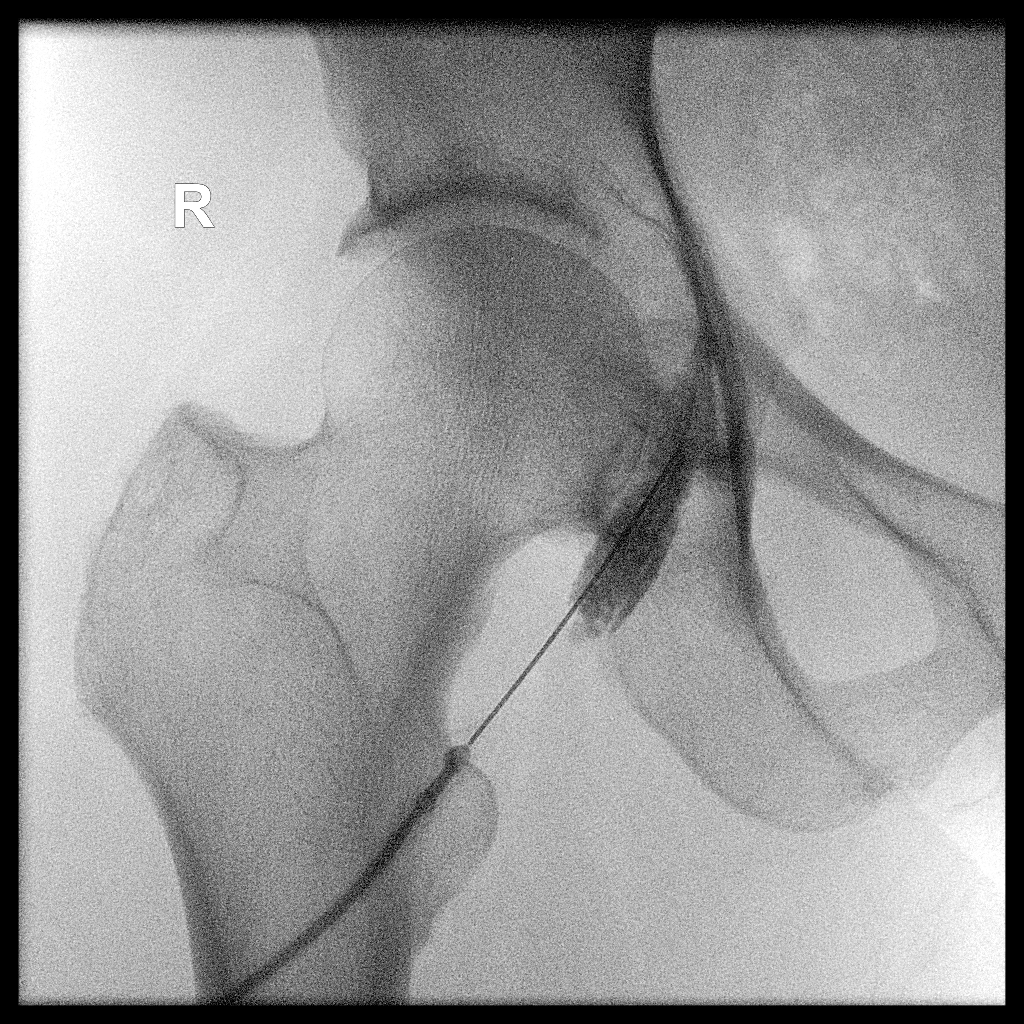

[1 of 1 positions shown; findings below may reference images not displayed]

FLUOROSCOPY TIME:  29 seconds corresponding to a Dose Area Product
of 33.43 Gy*m2

PROCEDURE:
RIGHT ILIOPSOAS BURSA INJECTION UNDER FLUOROSCOPY

Informed written consent was obtained.  Time-out was performed.

An appropriate skin entrance site was determined. The site was
marked, prepped with Betadine, draped in the usual sterile fashion,
and infiltrated locally with 1% lidocaine.

A 22 gauge spinal needle was placed along the expected course of the
iliopsoas bursa and tendon. Injection with 1% lidocaine was
performed until the with loss of resistance. Contrast injection
showed expected spread along the iliopsoas tendon. I injected 120 mg
Depo-Medrol along with 2 mL 1% lidocaine.
IMPRESSION: Technically successful RIGHT iliopsoas bursa injection under
fluoroscopic guidance.

## 2020-01-15 ENCOUNTER — Ambulatory Visit (INDEPENDENT_AMBULATORY_CARE_PROVIDER_SITE_OTHER): Admitting: Rehabilitative and Restorative Service Providers"

## 2020-01-15 ENCOUNTER — Other Ambulatory Visit: Payer: Self-pay

## 2020-01-15 DIAGNOSIS — R2689 Other abnormalities of gait and mobility: Secondary | ICD-10-CM | POA: Diagnosis not present

## 2020-01-15 DIAGNOSIS — M6281 Muscle weakness (generalized): Secondary | ICD-10-CM

## 2020-01-15 DIAGNOSIS — M79604 Pain in right leg: Secondary | ICD-10-CM | POA: Diagnosis not present

## 2020-01-15 DIAGNOSIS — R29898 Other symptoms and signs involving the musculoskeletal system: Secondary | ICD-10-CM | POA: Diagnosis not present

## 2020-01-15 NOTE — Therapy (Signed)
[place], [place], [address] Phone: [phone]   Fax:  [phone]  Physical Therapy Evaluation  Patient Details  Name: Catherine Grant MRN: [id number] Date of Birth: 1961-10-03 Referring Provider (PT): Matt Holmes, MD   Encounter Date: 01/15/2020  PT End of Session - 01/15/20 1635    Visit Number  1    Number of Visits  12    Date for PT Re-Evaluation  02/26/20    PT Start Time  0263    PT Stop Time  1520    PT Time Calculation (min)  42 min       Past Medical History:  Diagnosis Date  . Hyperlipidemia   . Pneumohemothorax    15 - 20  YRS AGO   CT PLACED  . PONV (postoperative nausea and vomiting)     Past Surgical History:  Procedure Laterality Date  . ABDOMINAL HYSTERECTOMY     partial  . BREAST LUMPECTOMY     RT      1981   . CHEST TUBE INSERTION    . TRANSFORAMINAL LUMBAR INTERBODY FUSION (TLIF) WITH PEDICLE SCREW FIXATION 1 LEVEL N/A 01/26/2016   Procedure: L4-5 TRANSFORAMINAL LUMBAR INTERBODY FUSION (TLIF) WITH PEDICLE SCREW FIXATION ;  Surgeon: Eustace Moore, MD;  Location: [facility];  Service: Neurosurgery;  Laterality: N/A;  L4-5 TRANSFORAMINAL LUMBAR INTERBODY FUSION (TLIF) WITH PEDICLE SCREW FIXATION     There were no vitals filed for this visit.   Subjective Assessment - 01/15/20 1443    Subjective  The patient reports she is unable to stand on the right leg without it shaking and her balance is terrible.  She feels weakness and pain in the R hip and also has pain on the L side due to increased weight shift to the right.  She reports this weakness has been there for 1-1.5 years.  She had back surgery in 2017 and reported she had recovered without these changes in gait untl the last 1-1.5 years.    Pertinent History  lumbar surgery 2017    Patient Stated Goals  "I want to be able to walk without shaking.  Walk normal."    Currently in Pain?  Yes    Pain Score  0-No  pain   gets pain in R hip   Pain Location  Hip    Pain Orientation  Right    Pain Descriptors / Indicators  Aching;Numbness;Sore    Pain Type  Chronic pain    Pain Onset  More than a month ago    Pain Frequency  Intermittent    Aggravating Factors   sitting for long periods or overdoing it aggravate pain    Pain Relieving Factors  gentle movement         [facility] PT Assessment - 01/15/20 1446      Assessment   Medical Diagnosis  gait abnormality, lumbar radiculopathy, R leg weakness, weakness of R foot    Referring Provider (PT)  Matt Holmes, MD    Onset Date/Surgical Date  12/28/19    Hand Dominance  Right    Prior Therapy  known to our clinic from prior PT s/p back surgery      Precautions   Precautions  Fall      Restrictions   Weight Bearing Restrictions  No      Balance Screen   Has the patient fallen in the past 6 months  No  Has the patient had a decrease in activity level because of a fear of falling?   No    Is the patient reluctant to leave their home because of a fear of falling?   No      Home Environment   Living Environment  Private residence    Living Arrangements  Spouse/significant other    Type of Home  House    Home Access  Stairs to enter    Entrance Stairs-Number of Steps  2    Entrance Stairs-Rails  None    Home Layout  Two level      Prior Function   Level of Independence  Independent      Sensation   Light Touch  --   reports numbness and pain waist down     Posture/Postural Control   Posture/Postural Control  Postural limitations    Posture Comments  In standing, the patient shifts to the L LE and has increased knee movement into flexion/extension      ROM / Strength   AROM / PROM / Strength  AROM;Strength      AROM   Overall AROM   Within functional limits for tasks performed      Strength   Overall Strength  Deficits    Strength Assessment Site  Hip;Knee;Ankle    Right/Left Hip  Right;Left    Right Hip Flexion  4/5    patient has general shaking with resistance in R musculature   Left Hip Flexion  5/5    Right/Left Knee  Right;Left    Right Knee Flexion  5/5    Right Knee Extension  5/5    Left Knee Flexion  5/5    Left Knee Extension  5/5    Right/Left Ankle  Right;Left    Right Ankle Dorsiflexion  5/5    Right Ankle Plantar Flexion  5/5    Right Ankle Inversion  5/5    Right Ankle Eversion  5/5    Left Ankle Dorsiflexion  5/5    Left Ankle Plantar Flexion  5/5    Left Ankle Inversion  5/5    Left Ankle Eversion  5/5      Flexibility   Soft Tissue Assessment /Muscle Length  yes    Hamstrings  tightness bilaterally    Piriformis  tightness bilaterally      Transfers   Five time sit to stand comments   20.44 seconds      Ambulation/Gait   Ambulation/Gait  Yes    Ambulation/Gait Assistance  6: Modified independent (Device/Increase time)   slowed pace, abnormal mechanics   Ambulation Distance (Feet)  100 Feet    Assistive device  None    Gait Pattern  Decreased stance time - right;Decreased arm swing - right;Decreased arm swing - left;Right steppage;Decreased weight shift to right;Right foot flat;Right flexed knee in stance   pain in proximal R hip with gait   Gait velocity  1.43 ft/sec    Stairs  Yes    Stairs Assistance  6: Modified independent (Device/Increase time)    Stair Management Technique  Two rails;Alternating pattern    Number of Stairs  4                Objective measurements completed on examination: See above findings.      [facility] Adult PT Treatment/Exercise - 01/15/20 1446      Exercises   Exercises  Knee/Hip      Knee/Hip Exercises: Stretches   Active Hamstring  Stretch  Right;Left;1 rep;30 seconds    Piriformis Stretch  Right;Left;1 rep;30 seconds      Knee/Hip Exercises: Seated   Sit to Sand  5 reps;without UE support      Knee/Hip Exercises: Supine   Bridges  Strengthening;Both;5 reps             PT Education - 01/15/20 1634     Education Details  HEP initiated    Person(s) Educated  Patient    Methods  Explanation;Demonstration;Handout    Comprehension  Returned demonstration;Verbalized understanding          PT Long Term Goals - 01/15/20 1635      PT LONG TERM GOAL #1   Title  The patient will be indep with HEP.    Time  6    Period  Weeks    Target Date  02/26/20      PT LONG TERM GOAL #2   Title  The patient will reduce 5 time sit to stand to < or equal to 16 seconds.    Baseline  20.77 seconds    Time  6    Period  Weeks    Target Date  02/26/20      PT LONG TERM GOAL #3   Title  The patient will imrpove gait speed from 1.43 ft/sec to > or equal to 2.2 ft/sec to demonstrate improved functional mobility.    Time  6    Period  Weeks    Target Date  02/26/20      PT LONG TERM GOAL #4   Title  The patient will report no pain in L hip after walking x 200 ft.    Time  6    Period  Weeks    Target Date  02/26/20      PT LONG TERM GOAL #5   Title  The patient will negotiate 4 steps without handrail to demonstrate improving functional strength.    Time  6    Period  Weeks    Target Date  02/26/20             Plan - 01/15/20 1712    Clinical Impression Statement  The patient is a 59 yo female presenting to OP physical therapy with abnormality of gait, h/o lumbar surgery, R leg weakness.  She presents with dec'd flexibility bialteral hamstrings, dec'd flexibility bilateral piriformis musculature, pain in proximal R hip after activity, abnormality of gait with variable movement patterns, decreased right single leg stance control and slowed gait speed.  Impairments lead to functional limitations of dec'd household and community mobility, and dec'd participation in wellness program.  Patient looks unsteady with gait, however notes no falls in the past 6 months.    Personal Factors and Comorbidities  Comorbidity 1    Comorbidities  h/o lumbar surgery 2017    Examination-Activity Limitations   Locomotion Level;Stand    Examination-Participation Restrictions  Community Activity    Stability/Clinical Decision Making  Stable/Uncomplicated    Clinical Decision Making  Low    Rehab Potential  Good    PT Frequency  2x / week    PT Duration  6 weeks    PT Treatment/Interventions  ADLs/Self Care Home Management;Gait training;Stair training;Functional mobility training;Therapeutic activities;Therapeutic exercise;Balance training;Neuromuscular re-education;Manual techniques;Dry needling;Taping;Patient/family education;Moist Heat;Electrical Stimulation    PT Next Visit Plan  continue functional strengthening including squats, progress bridges, core stability, review HEP (progress), try standing heel/toe raises, work on gait (forwards/backwards walking)    Consulted and  Agree with Plan of Care  Patient       Patient will benefit from skilled therapeutic intervention in order to improve the following deficits and impairments:  Abnormal gait, Difficulty walking, Decreased strength, Impaired flexibility, Decreased balance, Pain  Visit Diagnosis: Other abnormalities of gait and mobility  Muscle weakness (generalized)  Pain in right leg  Other symptoms and signs involving the musculoskeletal system     Problem List Patient Active Problem List   Diagnosis Date Noted  . Focal dystonia 08/25/2018  . Tremor 08/25/2018  . Low vitamin D level 10/05/2016  . Myelopathy (HCC) 09/07/2016  . Abnormal brain MRI 09/07/2016  . Right foot drop 07/27/2016  . Gait disturbance 07/27/2016  . 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 S/P lumbar spinal fusion 01/26/2016    Terriann Difonzo, PT 01/15/2020, 5:20 PM  [facility] [address] [place], [place], [address] Phone: [phone]   Fax:  [phone]  Name: Derrian Rodak MRN: [id number] Date of Birth: Aug 27, 1961

## 2020-01-15 NOTE — Patient Instructions (Signed)
Access Code: [email or web address] URL: [email or web address] Date: 01/15/2020 Prepared by: Margretta Ditty  Program Notes Begin with home recumbent bike and ride 5-10 minutes at low resistance (slowly getting used to it-- don't overdo).  Complete 3-4 times per week.   Exercises Seated Table Piriformis Stretch - 2 x daily - 7 x weekly - 1 sets - 3 reps - 20-30 seconds hold Seated Hamstring Stretch with Chair - 2 x daily - 7 x weekly - 1 sets - 3 reps - 20-30 seconds hold Supine Bridge - 2 x daily - 7 x weekly - 1 sets - 10 reps Sit to Stand with Hands on Knees - 2 x daily - 7 x weekly - 1 sets - 10 reps

## 2020-01-19 ENCOUNTER — Telehealth: Payer: Self-pay | Admitting: Rehabilitative and Restorative Service Providers"

## 2020-01-19 NOTE — Addendum Note (Signed)
Addended by: Margretta Ditty M on: 01/19/2020 03:37 PM   Modules accepted: Orders

## 2020-01-19 NOTE — Telephone Encounter (Signed)
The patient called and reported she has to reduce weight bearing through her foot for 3 weeks.  Cancelled visits and will return in 3 weeks. Magdiel Bartles, PT

## 2020-01-20 ENCOUNTER — Encounter: Admitting: Physical Therapy

## 2020-01-22 ENCOUNTER — Encounter: Admitting: Rehabilitative and Restorative Service Providers"

## 2020-02-01 ENCOUNTER — Encounter: Admitting: Rehabilitative and Restorative Service Providers"

## 2020-02-04 ENCOUNTER — Encounter: Admitting: Rehabilitative and Restorative Service Providers"

## 2020-02-08 ENCOUNTER — Encounter: Admitting: Rehabilitative and Restorative Service Providers"

## 2020-02-11 ENCOUNTER — Encounter: Admitting: Rehabilitative and Restorative Service Providers"

## 2020-03-17 ENCOUNTER — Encounter: Admitting: Rehabilitative and Restorative Service Providers"

## 2021-10-21 ENCOUNTER — Other Ambulatory Visit: Payer: Self-pay

## 2021-10-21 ENCOUNTER — Emergency Department (INDEPENDENT_AMBULATORY_CARE_PROVIDER_SITE_OTHER)
Admission: EM | Admit: 2021-10-21 | Discharge: 2021-10-21 | Disposition: A | Discharge status: Home / Self Care | Source: Home / Self Care

## 2021-10-21 DIAGNOSIS — J069 Acute upper respiratory infection, unspecified: Secondary | ICD-10-CM | POA: Diagnosis not present

## 2021-10-21 MED ORDER — AZITHROMYCIN 250 MG PO TABS: ORAL_TABLET | ORAL | 0 refills | Status: DC

## 2021-10-21 MED ORDER — PREDNISONE 20 MG PO TABS: 20.0000 mg | ORAL_TABLET | Freq: Two times a day (BID) | ORAL | 0 refills | Status: DC

## 2021-10-21 NOTE — Discharge Instructions (Signed)
Drink lots of water Take prednisone 2 times a day for 5 days.  Take 2 doses today Take Mucinex DM 2 times a day as directed If you are not feeling better in a couple days, or if your symptoms worsen you may take the azithromycin See your doctor if not improving by next week

## 2021-10-21 NOTE — ED Provider Notes (Signed)
[facility]    CSN: [id number] Arrival date & time: 10/21/21  0808      History   Chief Complaint Chief Complaint  Patient presents with   Nasal Congestion   Laryngitis    HPI Catherine Grant is a 60 y.o. female.   HPI  Patient has an upper respiratory infection.  Clear runny nose mucus.  Laryngitis.  Symptoms for 4 days.  Low-grade fever.  Mild cough.  She states she is having a lot of sinus pressure and postnasal drip.  No fever or chills.  No suspected influenza.  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   S/P lumbar spinal fusion 01/26/2016    Past Surgical History:  Procedure Laterality Date   ABDOMINAL HYSTERECTOMY     partial   BREAST LUMPECTOMY     RT      1981    CHEST TUBE INSERTION     TRANSFORAMINAL LUMBAR INTERBODY FUSION (TLIF) WITH PEDICLE SCREW FIXATION 1 LEVEL N/A 01/26/2016   Procedure: L4-5 TRANSFORAMINAL LUMBAR INTERBODY FUSION (TLIF) WITH PEDICLE SCREW FIXATION ;  Surgeon: Tia Alert, MD;  Location: [facility];  Service: Neurosurgery;  Laterality: N/A;  L4-5 TRANSFORAMINAL LUMBAR INTERBODY FUSION (TLIF) WITH PEDICLE SCREW FIXATION     OB History   No obstetric history on file.      Home Medications    Prior to Admission medications   Medication Sig Start Date End Date Taking? Authorizing Provider  azithromycin (ZITHROMAX Z-PAK) 250 MG tablet Take two pills today followed by one a day until gone 10/21/21  Yes Eustace Moore, MD  predniSONE (DELTASONE) 20 MG tablet Take 1  tablet (20 mg total) by mouth 2 (two) times daily with a meal. 10/21/21  Yes Eustace Moore, MD  baclofen (LIORESAL) 10 MG tablet Take one tablet in am, one tablet midday, two tablets in pm. 07/10/18   Sater, Pearletha Furl, MD  gabapentin (NEURONTIN) 300 MG capsule TAKE 1 CAPSULE BY MOUTH THREE TIMES DAILY AS NEEDED 09/29/18   Sater, Pearletha Furl, MD  propranolol ER (INDERAL LA) 80 MG 24 hr capsule Take 1 capsule (80 mg total) by mouth daily. Patient not taking: Reported on 01/15/2020 08/25/18   Asa Lente, MD  simvastatin (ZOCOR) 80 MG tablet Take 40 mg by mouth daily.    [provider]    Family History Family History  Problem Relation Age of Onset   Cancer Father        Lung CA   Lung cancer Father    Cancer Sister        Breast CA   Glaucoma Mother     Social History Social History   Tobacco Use   Smoking status: Never   Smokeless tobacco: Never  Substance Use Topics   Alcohol use: No   Drug use: No     Allergies   Bupropion   Review of Systems Review of Systems See HPI  Physical Exam  Triage Vital Signs ED Triage Vitals  Enc Vitals Group     BP 10/21/21 0815 123/86     Pulse Rate 10/21/21 0815 93     Resp 10/21/21 0815 14     Temp 10/21/21 0815 98.4 F (36.9 C)     Temp Source 10/21/21 0815 Oral     SpO2 10/21/21 0815 100 %     Weight --      Height --      Head Circumference --      Peak Flow --      Pain Score 10/21/21 0816 0     Pain Loc --      Pain Edu? --      Excl. in GC? --    No data found.  Updated Vital Signs BP 123/86 (BP Location: Right Arm)    Pulse 93    Temp 98.4 F (36.9 C) (Oral)    Resp 14    SpO2 100%       Physical Exam Constitutional:      General: She is not in acute distress.    Appearance: She is well-developed. She is ill-appearing.  HENT:     Head: Normocephalic and atraumatic.     Right Ear: Tympanic membrane and ear canal normal.     Left Ear: Tympanic membrane and ear canal normal.     Nose:  Congestion and rhinorrhea present.     Mouth/Throat:     Pharynx: Posterior oropharyngeal erythema present.  Eyes:     Conjunctiva/sclera: Conjunctivae normal.     Pupils: Pupils are equal, round, and reactive to light.  Cardiovascular:     Rate and Rhythm: Normal rate and regular rhythm.     Heart sounds: Normal heart sounds.  Pulmonary:     Effort: Pulmonary effort is normal. No respiratory distress.  Abdominal:     General: There is no distension.     Palpations: Abdomen is soft.  Musculoskeletal:        General: Normal range of motion.     Cervical back: Normal range of motion.  Lymphadenopathy:     Cervical: No cervical adenopathy.  Skin:    General: Skin is warm and dry.  Neurological:     Mental Status: She is alert.  Psychiatric:        Mood and Affect: Mood normal.        Behavior: Behavior normal.     UC Treatments / Results  Labs (all labs ordered are listed, but only abnormal results are displayed) Labs Reviewed - No data to display  EKG   Radiology No results found.  Procedures Procedures (including critical care time)  Medications Ordered in UC Medications - No data to display  Initial Impression / Assessment and Plan / UC Course  I have reviewed the triage vital signs and the nursing notes.  Pertinent labs & imaging results that were available during my care of the patient were reviewed by me and considered in my medical decision making (see chart for details).     I told patient this is likely a viral illness.  I encouraged over-the-counter medicines for the first few days.  If she fails to improve she has a prescription for Z-Pak to fill and take Final Clinical Impressions(s) / UC Diagnoses   Final diagnoses:  Viral upper respiratory tract infection     Discharge Instructions      Drink lots of water Take prednisone 2 times a day for 5 days.  Take 2 doses today Take Mucinex DM 2 times a day as directed If you are not feeling better  in a couple days, or if your symptoms worsen you may take the azithromycin See your doctor if not improving by next week   ED Prescriptions     Medication Sig Dispense Auth. Provider   predniSONE (DELTASONE) 20 MG tablet Take 1 tablet (20 mg total) by mouth 2 (two) times daily with a meal. 10 tablet Eustace Moore, MD   azithromycin (ZITHROMAX Z-PAK) 250 MG tablet Take two pills today followed by one a day until gone 6 tablet Delton See Letta Pate, MD      PDMP not reviewed this encounter.   Eustace Moore, MD 10/21/21 1037

## 2021-10-21 NOTE — ED Triage Notes (Signed)
Pt presents with loss of voice and congestion. Pt denies any body aches, fever, cough.

## 2023-08-27 ENCOUNTER — Telehealth: Payer: Self-pay

## 2023-08-27 ENCOUNTER — Ambulatory Visit
Admission: EM | Admit: 2023-08-27 | Discharge: 2023-08-27 | Disposition: A | Discharge status: Home / Self Care | Payer: Medicare Other | Attending: Family Medicine | Admitting: Family Medicine

## 2023-08-27 ENCOUNTER — Ambulatory Visit: Payer: Medicare Other

## 2023-08-27 DIAGNOSIS — S6992XA Unspecified injury of left wrist, hand and finger(s), initial encounter: Secondary | ICD-10-CM | POA: Diagnosis not present

## 2023-08-27 DIAGNOSIS — M25532 Pain in left wrist: Secondary | ICD-10-CM | POA: Diagnosis not present

## 2023-08-27 MED ORDER — CELECOXIB 200 MG PO CAPS: 200.0000 mg | ORAL_CAPSULE | Freq: Every day | ORAL | 0 refills | Status: AC

## 2023-08-27 NOTE — ED Provider Notes (Signed)
[facility]    CSN: [id number] Arrival date & time: 08/27/23  4098      History   Chief Complaint Chief Complaint  Patient presents with   Wrist Pain    HPI Dorene Kirchner is a 62 y.o. female.   HPI  62 year old female presents with left wrist pain secondary to injury that occurred last night.  She reports pushing her mother's wheelchair and felt a pop in her wrist and now has moderate pain in 2 left ulnar aspect of wrist.  Patient reports pain is worse with movement.  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   S/P lumbar spinal fusion 01/26/2016    Past Surgical History:  Procedure Laterality Date   ABDOMINAL HYSTERECTOMY     partial   BREAST LUMPECTOMY     RT      1981    CHEST TUBE INSERTION     TRANSFORAMINAL LUMBAR INTERBODY FUSION (TLIF) WITH PEDICLE SCREW FIXATION 1 LEVEL N/A 01/26/2016   Procedure: L4-5 TRANSFORAMINAL LUMBAR INTERBODY FUSION (TLIF) WITH PEDICLE SCREW FIXATION ;  Surgeon: Tia Alert, MD;  Location: [facility];  Service: Neurosurgery;  Laterality: N/A;  L4-5 TRANSFORAMINAL LUMBAR INTERBODY FUSION (TLIF) WITH PEDICLE SCREW FIXATION     OB History   No obstetric history on file.      Home Medications    Prior to Admission medications   Medication Sig Start Date End Date Taking? Authorizing Provider  celecoxib (CELEBREX) 200 MG capsule Take 1 capsule (200 mg total) by mouth daily for 15 days. 08/27/23 09/11/23 Yes Trevor Iha, FNP  gabapentin (NEURONTIN) 300 MG  capsule TAKE 1 CAPSULE BY MOUTH THREE TIMES DAILY AS NEEDED 09/29/18  Yes Sater, Pearletha Furl, MD  propranolol ER (INDERAL LA) 80 MG 24 hr capsule Take 1 capsule (80 mg total) by mouth daily. 08/25/18  Yes Sater, Pearletha Furl, MD  simvastatin (ZOCOR) 80 MG tablet Take 40 mg by mouth daily.   Yes [provider]    Family History Family History  Problem Relation Age of Onset   Cancer Father        Lung CA   Lung cancer Father    Cancer Sister        Breast CA   Glaucoma Mother     Social History Social History   Tobacco Use   Smoking status: Never   Smokeless tobacco: Never  Vaping Use   Vaping status: Never Used  Substance Use Topics   Alcohol use: No   Drug use: No     Allergies   Bupropion   Review of Systems Review of Systems  Musculoskeletal:        Left wrist pain     Physical Exam Triage Vital Signs ED Triage Vitals  Encounter Vitals Group     BP      Systolic BP Percentile      Diastolic BP Percentile      Pulse  Resp      Temp      Temp src      SpO2      Weight      Height      Head Circumference      Peak Flow      Pain Score      Pain Loc      Pain Education      Exclude from Growth Chart    No data found.  Updated Vital Signs BP 121/84 (BP Location: Right Arm)   Pulse 64   Temp 98.4 F (36.9 C) (Oral)   Resp 18   SpO2 99%   Physical Exam Vitals and nursing note reviewed.  Constitutional:      Appearance: Normal appearance. She is normal weight.  HENT:     Head: Normocephalic and atraumatic.     Mouth/Throat:     Mouth: Mucous membranes are moist.     Pharynx: Oropharynx is clear.  Eyes:     Extraocular Movements: Extraocular movements intact.     Conjunctiva/sclera: Conjunctivae normal.     Pupils: Pupils are equal, round, and reactive to light.  Cardiovascular:     Rate and Rhythm: Normal rate and regular rhythm.     Pulses: Normal pulses.     Heart sounds: Normal heart sounds.  Pulmonary:     Effort:  Pulmonary effort is normal.     Breath sounds: Normal breath sounds. No wheezing, rhonchi or rales.  Musculoskeletal:        General: Normal range of motion.     Cervical back: Normal range of motion and neck supple.     Comments: Left wrist (distal volar aspect): TTP with some mild soft tissue swelling noted, F ROM with flexion/extension/ulnar deviation, negative Finkelstein, negative ulnar deviation, negative Tinel's sign, grip is 5/5, neurovascular/neurosensory intact  Skin:    General: Skin is warm and dry.  Neurological:     General: No focal deficit present.     Mental Status: She is alert and oriented to person, place, and time. Mental status is at baseline.  Psychiatric:        Mood and Affect: Mood normal.        Behavior: Behavior normal.      UC Treatments / Results  Labs (all labs ordered are listed, but only abnormal results are displayed) Labs Reviewed - No data to display  EKG   Radiology DG Wrist Complete Left  Result Date: 08/27/2023 CLINICAL DATA:  Left wrist pain after feeling a pop while pushing a wheelchair EXAM: LEFT WRIST - COMPLETE 3+ VIEW COMPARISON:  None Available. FINDINGS: There is no acute fracture or dislocation. Radiocarpal alignment is maintained. The joint spaces are preserved. There is no erosive change. The soft tissues are unremarkable. IMPRESSION: No acute fracture or dislocation. Electronically Signed   By: Lesia Hausen M.D.   On: 08/27/2023 12:19    Procedures Procedures (including critical care time)  Medications Ordered in UC Medications - No data to display  Initial Impression / Assessment and Plan / UC Course  I have reviewed the triage vital signs and the nursing notes.  Pertinent labs & imaging results that were available during my care of the patient were reviewed by me and considered in my medical decision making (see chart for details).     MDM: 1.  Injury of left wrist, initial encounter-left wrist brace placed on patient  prior to discharge with specific instructions provided to patient; 2.  Left wrist pain-left wrist x-ray reveals above, Rx'd Celebrex 200 mg capsule: Take 1 capsule daily x 15 days. Advised patient to take medication as directed with food to completion.  Encouraged to increase daily water intake to 64 ounces per day while taking this medication.  Advised patient to wear left wrist brace 24/7 for the next 2 weeks (except when bathing).  Advised if symptoms worsen after 10-14 days please follow-up with [facility] orthopedics for further evaluation.  Advised we will follow-up with left wrist x-ray results once received today.  Patient discharged home, hemodynamically stable.  Final Clinical Impressions(s) / UC Diagnoses   Final diagnoses:  Left wrist pain  Injury of left wrist, initial encounter     Discharge Instructions      Advised patient to take medication as directed with food to completion.  Encouraged to increase daily water intake to 64 ounces per day while taking this medication.  Advised patient to wear left wrist brace 24/7 for the next 2 weeks (except when bathing).  Advised if symptoms worsen after 10-14 days please follow-up with [facility] health orthopedics for further evaluation.  Advised we will follow-up with left wrist x-ray results once received today.     ED Prescriptions     Medication Sig Dispense Auth. Provider   celecoxib (CELEBREX) 200 MG capsule Take 1 capsule (200 mg total) by mouth daily for 15 days. 15 capsule Trevor Iha, FNP      I have reviewed the PDMP during this encounter.   Trevor Iha, FNP 08/27/23 1446

## 2023-08-27 NOTE — Discharge Instructions (Addendum)
Advised patient to take medication as directed with food to completion.  Encouraged to increase daily water intake to 64 ounces per day while taking this medication.  Advised patient to wear left wrist brace 24/7 for the next 2 weeks (except when bathing).  Advised if symptoms worsen after 10-14 days please follow-up with [facility] health orthopedics for further evaluation.  Advised we will follow-up with left wrist x-ray results once received today.

## 2023-08-27 NOTE — ED Triage Notes (Signed)
Pt c/o pain to L wrist since last night. Reports she was pushing her mother's wheelchair, when she felt a "pop" in her wrist. Had immediate pain to L ulnar aspect of wrist. Pain is worse with ROM.

## 2023-08-27 NOTE — Telephone Encounter (Signed)
Call made to pt to let know of neg xrays. Advised if sxs persist, contact sports med for further eval/work up of wrist pain.

## 2023-09-04 ENCOUNTER — Encounter: Payer: Self-pay | Admitting: Sports Medicine

## 2023-09-04 ENCOUNTER — Ambulatory Visit (INDEPENDENT_AMBULATORY_CARE_PROVIDER_SITE_OTHER): Payer: Medicare Other | Admitting: Sports Medicine

## 2023-09-04 VITALS — BP 122/84 | Ht 64.5 in | Wt 162.0 lb

## 2023-09-04 DIAGNOSIS — M25532 Pain in left wrist: Secondary | ICD-10-CM

## 2023-09-04 NOTE — Progress Notes (Signed)
   Subjective:    Patient ID: Catherine Grant, female    DOB: Jan 06, 1961, 62 y.o.   MRN: [id number]  HPI chief complaint: Left wrist pain  Patient is a 62 year old female that presents today with ulnar-sided left wrist pain.  The pain began acutely when she was pulling back on a wheelchair.  She felt a pop in the ulnar aspect of her wrist.  She was seen the following day at a local urgent care.  X-rays were obtained which were negative for fracture.  She was placed into a wrist brace and comes in today for follow-up.  She did get some initial swelling in the area of her injury.  She denies pain on the radial aspect of the hand or wrist.  She did suffer a fracture in the same wrist when she was 62 years old.  This was treated conservatively without surgery.  She is right-hand dominant.  Past medical history reviewed Medications reviewed Allergies reviewed  Review of Systems As above    Objective:   Physical Exam  Well-developed, well-nourished.  No acute distress  Left wrist: Full wrist range of motion.  No effusion.  No soft tissue swelling.  No ecchymosis.  There is tenderness to palpation along the distal course of the ECU tendon along the ulnar aspect of the wrist.  Reproducible pain with ulnar deviation.  No tenderness to palpation at the TFCC.  No other bony or soft tissue tenderness to palpation.  Good pulses.  X-rays of the left wrist are as above.      Assessment & Plan:   Left wrist pain secondary to ECU tendon strain  Patient will continue with her wrist brace for the next 3 weeks and will wear it when active during the day.  She does not need to sleep in it.  She may use topical over-the-counter medication such as Voltaren gel or capsaicin as needed for pain relief.  Follow-up again in 3 weeks for reevaluation.  If symptoms persist at follow-up then consider merits of further diagnostic imaging at that time.  This note was dictated using Dragon naturally speaking software  and may contain errors in syntax, spelling, or content which have not been identified prior to signing this note.

## 2023-09-04 NOTE — Patient Instructions (Addendum)
Good to see you! Voltaren gel and capsaicin are the 2 we discussed.  Please see [place] back in 3 weeks.

## 2023-10-03 ENCOUNTER — Ambulatory Visit: Payer: Medicare Other | Admitting: Family Medicine

## 2024-09-09 ENCOUNTER — Encounter: Payer: Self-pay | Admitting: Emergency Medicine

## 2024-09-09 ENCOUNTER — Ambulatory Visit

## 2024-09-09 ENCOUNTER — Ambulatory Visit
Admission: EM | Admit: 2024-09-09 | Discharge: 2024-09-09 | Disposition: A | Discharge status: Home / Self Care | Attending: Family Medicine | Admitting: Family Medicine

## 2024-09-09 DIAGNOSIS — W19XXXA Unspecified fall, initial encounter: Secondary | ICD-10-CM

## 2024-09-09 DIAGNOSIS — M79671 Pain in right foot: Secondary | ICD-10-CM | POA: Diagnosis not present

## 2024-09-09 DIAGNOSIS — S99921A Unspecified injury of right foot, initial encounter: Secondary | ICD-10-CM | POA: Diagnosis not present

## 2024-09-09 DIAGNOSIS — G8929 Other chronic pain: Secondary | ICD-10-CM

## 2024-09-09 MED ORDER — OXYCODONE HCL 5 MG PO TABS: 5.0000 mg | ORAL_TABLET | ORAL | 0 refills | Status: AC | PRN

## 2024-09-09 NOTE — ED Triage Notes (Signed)
 Patient states that she fell down some stairs on Thursday injuring her right foot.  Her foot does have bruising and some swelling.  Patient has taken Aleve w/o relief.

## 2024-09-09 NOTE — Discharge Instructions (Signed)
 Limit walking while foot is painful Wear boot until you can comfortably walk without it May take Tylenol  or ibuprofen for moderate pain Take oxycodone  as needed for severe pain Do not drive on oxycodone  Be careful taking oxycodone  with your clonazepam because it can increase drowsiness Follow-up with your foot doctor

## 2024-09-09 NOTE — ED Provider Notes (Signed)
 [facility]    CSN: [id number] Arrival date & time: 09/09/24  0933      History   Chief Complaint Chief Complaint  Patient presents with   Fall    HPI Catherine Grant is a 63 y.o. female.   Patient has had multiple surgeries on her right foot.  She has chronic right foot pain.  She is under the care of a podiatrist.  Last saw her foot doctor in 08/31/2024 and received an injection.  She is here because she felt last Thursday on 09/03/2024.  She has bruising and swelling.  She states severe pain that is not relieved by ibuprofen or Tylenol .  She is here today in her cam walker boot.    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   S/P lumbar spinal fusion 01/26/2016    Past Surgical History:  Procedure Laterality Date   ABDOMINAL HYSTERECTOMY     partial   BREAST LUMPECTOMY     RT      1981    CHEST TUBE INSERTION     TRANSFORAMINAL LUMBAR INTERBODY FUSION (TLIF) WITH PEDICLE SCREW FIXATION 1 LEVEL N/A 01/26/2016   Procedure: L4-5 TRANSFORAMINAL LUMBAR INTERBODY FUSION (TLIF) WITH PEDICLE SCREW FIXATION ;  Surgeon: Alm GORMAN Molt, MD;  Location: [facility];  Service: Neurosurgery;  Laterality: N/A;  L4-5 TRANSFORAMINAL LUMBAR INTERBODY FUSION (TLIF) WITH PEDICLE SCREW FIXATION     OB History   No obstetric history on file.      Home Medications    Prior to Admission medications   Medication Sig Start Date End Date Taking? Authorizing Provider  gabapentin  (NEURONTIN ) 300 MG capsule TAKE 1 CAPSULE BY MOUTH THREE TIMES DAILY AS NEEDED 09/29/18  Yes Sater,  Charlie LABOR, MD  oxyCODONE  (ROXICODONE ) 5 MG immediate release tablet Take 1 tablet (5 mg total) by mouth every 4 (four) hours as needed for severe pain (pain score 7-10). 09/09/24  Yes Maranda Jamee Jacob, MD  propranolol  ER (INDERAL  LA) 80 MG 24 hr capsule Take 1 capsule (80 mg total) by mouth daily. 08/25/18  Yes Sater, Charlie LABOR, MD  simvastatin (ZOCOR) 80 MG tablet Take 40 mg by mouth daily.   Yes [provider]    Family History Family History  Problem Relation Age of Onset   Glaucoma Mother    Cancer Father        Lung CA   Lung cancer Father    Cancer Sister        Breast CA    Social History Social History   Tobacco Use   Smoking status: Never   Smokeless tobacco: Never  Vaping Use   Vaping status: Never Used  Substance Use Topics   Alcohol use: No   Drug use: No     Allergies   Bupropion   Review of Systems Review of Systems  See HPI Physical Exam Triage Vital Signs ED Triage Vitals  Encounter Vitals Group     BP 09/09/24 1004 130/69     Girls  Systolic BP Percentile --      Girls Diastolic BP Percentile --      Boys Systolic BP Percentile --      Boys Diastolic BP Percentile --      Pulse Rate 09/09/24 1004 63     Resp 09/09/24 1004 18     Temp 09/09/24 1004 99 F (37.2 C)     Temp Source 09/09/24 1004 Oral     SpO2 09/09/24 1004 95 %     Weight 09/09/24 1002 170 lb (77.1 kg)     Height 09/09/24 1002 5' 5 (1.651 m)     Head Circumference --      Peak Flow --      Pain Score 09/09/24 1002 9     Pain Loc --      Pain Education --      Exclude from Growth Chart --    No data found.  Updated Vital Signs BP 130/69 (BP Location: Right Arm)   Pulse 63   Temp 99 F (37.2 C) (Oral)   Resp 18   Ht 5' 5 (1.651 m)   Wt 77.1 kg   SpO2 95%   BMI 28.29 kg/m       Physical Exam Constitutional:      General: She is not in acute distress.    Appearance: She is well-developed.  HENT:     Head: Normocephalic and atraumatic.  Eyes:      Conjunctiva/sclera: Conjunctivae normal.     Pupils: Pupils are equal, round, and reactive to light.  Cardiovascular:     Rate and Rhythm: Normal rate.  Pulmonary:     Effort: Pulmonary effort is normal. No respiratory distress.  Musculoskeletal:        General: Normal range of motion.     Cervical back: Normal range of motion.       Feet:  Skin:    General: Skin is warm and dry.  Neurological:     Mental Status: She is alert.     Gait: Gait abnormal.      UC Treatments / Results  Labs (all labs ordered are listed, but only abnormal results are displayed) Labs Reviewed - No data to display  EKG   Radiology DG Foot Complete Right Result Date: 09/09/2024 EXAM: 3 OR MORE VIEW(S) XRAY OF THE RIGHT FOOT 09/09/2024 10:22:04 AM COMPARISON: None available. CLINICAL HISTORY: right foot injury FINDINGS: BONES AND JOINTS: Status post surgical fusion of first metatarsophalangeal joint. No acute fracture. No focal osseous lesion. No joint dislocation. SOFT TISSUES: The soft tissues are unremarkable. IMPRESSION: 1. Status post surgical fusion of the first metatarsophalangeal joint. Electronically signed by: Lynwood Seip MD 09/09/2024 10:45 AM EST RP Workstation: [id number]    Procedures Procedures (including critical care time)  Medications Ordered in UC Medications - No data to display  Initial Impression / Assessment and Plan / UC Course  I have reviewed the triage vital signs and the nursing notes.  Pertinent labs & imaging results that were available during my care of the patient were reviewed by me and considered in my medical decision making (see chart for details).     Since injury was a few days ago surprised that patient is requesting narcotic pain medication.  She states that the pain is severe.  I did review the narcotic database and it does not appear she has taken them regularly or in any worrisome fashion.  I did caution her against taking it with her clonazepam.  She  needs follow-up with her foot doctor Final Clinical Impressions(s) / UC Diagnoses   Final diagnoses:  Fall, initial encounter  Acute foot pain, right  Chronic pain in right foot     Discharge Instructions      Limit walking while foot is painful Wear boot until you can comfortably walk without it May take Tylenol  or ibuprofen for moderate pain Take oxycodone  as needed for severe pain Do not drive on oxycodone  Be careful taking oxycodone  with your clonazepam because it can increase drowsiness Follow-up with your foot doctor    ED Prescriptions     Medication Sig Dispense Auth. Provider   oxyCODONE  (ROXICODONE ) 5 MG immediate release tablet Take 1 tablet (5 mg total) by mouth every 4 (four) hours as needed for severe pain (pain score 7-10). 20 tablet Maranda Jamee Jacob, MD      I have reviewed the PDMP during this encounter.   Maranda Jamee Jacob, MD 09/09/24 [phone]
